# Patient Record
Sex: Male | Born: 1965 | Race: White | Hispanic: No | Marital: Married | Smoking: Never smoker
Health system: Southern US, Community
[De-identification: ages and names within clinical notes are randomized; demographics above are authoritative.]

## PROBLEM LIST (undated history)

## (undated) ENCOUNTER — Emergency Department (HOSPITAL_COMMUNITY): Discharge status: Absent without leave | Payer: Self-pay

## (undated) DIAGNOSIS — K649 Unspecified hemorrhoids: Secondary | ICD-10-CM

## (undated) DIAGNOSIS — Z9889 Other specified postprocedural states: Secondary | ICD-10-CM

## (undated) DIAGNOSIS — R112 Nausea with vomiting, unspecified: Secondary | ICD-10-CM

## (undated) DIAGNOSIS — G4733 Obstructive sleep apnea (adult) (pediatric): Secondary | ICD-10-CM

## (undated) DIAGNOSIS — E785 Hyperlipidemia, unspecified: Secondary | ICD-10-CM

## (undated) DIAGNOSIS — N529 Male erectile dysfunction, unspecified: Secondary | ICD-10-CM

## (undated) DIAGNOSIS — J45909 Unspecified asthma, uncomplicated: Secondary | ICD-10-CM

## (undated) DIAGNOSIS — M2550 Pain in unspecified joint: Secondary | ICD-10-CM

## (undated) DIAGNOSIS — Z87442 Personal history of urinary calculi: Secondary | ICD-10-CM

## (undated) DIAGNOSIS — R51 Headache: Secondary | ICD-10-CM

## (undated) DIAGNOSIS — Z8709 Personal history of other diseases of the respiratory system: Secondary | ICD-10-CM

## (undated) DIAGNOSIS — J302 Other seasonal allergic rhinitis: Secondary | ICD-10-CM

## (undated) DIAGNOSIS — J189 Pneumonia, unspecified organism: Secondary | ICD-10-CM

## (undated) DIAGNOSIS — K469 Unspecified abdominal hernia without obstruction or gangrene: Secondary | ICD-10-CM

## (undated) DIAGNOSIS — E119 Type 2 diabetes mellitus without complications: Secondary | ICD-10-CM

## (undated) HISTORY — PX: COLONOSCOPY: SHX174

## (undated) HISTORY — DX: Hyperlipidemia, unspecified: E78.5

## (undated) HISTORY — PX: MYRINGOTOMY: SUR874

## (undated) HISTORY — PX: ADENOIDECTOMY: SUR15

## (undated) HISTORY — PX: KNEE ARTHROSCOPY W/ ACL RECONSTRUCTION: SHX1858

## (undated) HISTORY — DX: Unspecified abdominal hernia without obstruction or gangrene: K46.9

## (undated) HISTORY — PX: CERVICAL SPINE SURGERY: SHX589

## (undated) HISTORY — PX: BUNIONECTOMY: SHX129

## (undated) HISTORY — PX: SHOULDER ARTHROSCOPY WITH ROTATOR CUFF REPAIR: SHX5685

## (undated) HISTORY — DX: Obstructive sleep apnea (adult) (pediatric): G47.33

## (undated) HISTORY — PX: ESOPHAGOGASTRODUODENOSCOPY: SHX1529

## (undated) HISTORY — DX: Male erectile dysfunction, unspecified: N52.9

## (undated) HISTORY — PX: NOSE SURGERY: SHX723

---

## 1996-06-17 HISTORY — PX: CHOLECYSTECTOMY: SHX55

## 2001-05-19 ENCOUNTER — Ambulatory Visit (HOSPITAL_COMMUNITY): Admission: RE | Admit: 2001-05-19 | Discharge: 2001-05-19 | Payer: Self-pay | Admitting: *Deleted

## 2001-05-19 ENCOUNTER — Encounter: Payer: Self-pay | Admitting: *Deleted

## 2001-08-18 ENCOUNTER — Encounter: Payer: Self-pay | Admitting: Critical Care Medicine

## 2001-08-18 ENCOUNTER — Ambulatory Visit (HOSPITAL_COMMUNITY): Admission: RE | Admit: 2001-08-18 | Discharge: 2001-08-18 | Payer: Self-pay | Admitting: Critical Care Medicine

## 2002-07-02 ENCOUNTER — Encounter: Payer: Self-pay | Admitting: Family Medicine

## 2002-07-02 ENCOUNTER — Ambulatory Visit (HOSPITAL_COMMUNITY): Admission: RE | Admit: 2002-07-02 | Discharge: 2002-07-02 | Payer: Self-pay | Admitting: Family Medicine

## 2004-06-07 ENCOUNTER — Ambulatory Visit: Payer: Self-pay | Admitting: Family Medicine

## 2004-10-24 ENCOUNTER — Emergency Department (HOSPITAL_COMMUNITY): Admission: EM | Admit: 2004-10-24 | Discharge: 2004-10-24 | Payer: Self-pay | Admitting: Emergency Medicine

## 2006-02-01 ENCOUNTER — Emergency Department (HOSPITAL_COMMUNITY): Admission: EM | Admit: 2006-02-01 | Discharge: 2006-02-01 | Payer: Self-pay | Admitting: Emergency Medicine

## 2006-08-26 ENCOUNTER — Emergency Department (HOSPITAL_COMMUNITY): Admission: EM | Admit: 2006-08-26 | Discharge: 2006-08-26 | Payer: Self-pay | Admitting: Emergency Medicine

## 2007-08-26 ENCOUNTER — Emergency Department (HOSPITAL_COMMUNITY): Admission: EM | Admit: 2007-08-26 | Discharge: 2007-08-26 | Payer: Self-pay | Admitting: Emergency Medicine

## 2008-11-28 ENCOUNTER — Encounter: Admission: RE | Admit: 2008-11-28 | Discharge: 2008-11-28 | Payer: Self-pay | Admitting: Sports Medicine

## 2008-12-12 ENCOUNTER — Encounter: Admission: RE | Admit: 2008-12-12 | Discharge: 2008-12-12 | Payer: Self-pay | Admitting: Sports Medicine

## 2008-12-26 ENCOUNTER — Encounter: Admission: RE | Admit: 2008-12-26 | Discharge: 2008-12-26 | Payer: Self-pay | Admitting: Sports Medicine

## 2009-05-02 ENCOUNTER — Ambulatory Visit (HOSPITAL_COMMUNITY): Admission: RE | Admit: 2009-05-02 | Discharge: 2009-05-02 | Payer: Self-pay | Admitting: Neurosurgery

## 2009-05-17 HISTORY — PX: CERVICAL FUSION: SHX112

## 2009-06-01 ENCOUNTER — Ambulatory Visit (HOSPITAL_COMMUNITY): Admission: RE | Admit: 2009-06-01 | Discharge: 2009-06-02 | Payer: Self-pay | Admitting: Neurosurgery

## 2010-09-18 LAB — BASIC METABOLIC PANEL
BUN: 12 mg/dL (ref 6–23)
CO2: 24 mEq/L (ref 19–32)
Calcium: 9.1 mg/dL (ref 8.4–10.5)
Chloride: 105 mEq/L (ref 96–112)
Creatinine, Ser: 0.99 mg/dL (ref 0.4–1.5)
GFR calc Af Amer: >60 mL/min (ref >60)
GFR calc non Af Amer: >60 mL/min (ref >60)
Glucose, Bld: 91 mg/dL (ref 70–99)
Potassium: 4.4 mEq/L (ref 3.5–5.1)
Sodium: 137 mEq/L (ref 135–145)

## 2010-09-18 LAB — CBC
HCT: 45.6 % (ref 39.0–52.0)
Hemoglobin: 15.7 g/dL (ref 13.0–17.0)
MCHC: 34.5 g/dL (ref 30.0–36.0)
MCV: 89.3 fL (ref 78.0–100.0)
Platelets: 229 10*3/uL (ref 150–400)
RBC: 5.1 MIL/uL (ref 4.22–5.81)
RDW: 12.4 % (ref 11.5–15.5)
WBC: 5.8 10*3/uL (ref 4.0–10.5)

## 2011-03-11 LAB — URINALYSIS, ROUTINE W REFLEX MICROSCOPIC
Bilirubin Urine: NEGATIVE
Glucose, UA: NEGATIVE
Hgb urine dipstick: NEGATIVE
Ketones, ur: NEGATIVE
Nitrite: NEGATIVE
Protein, ur: NEGATIVE
Specific Gravity, Urine: 1.038 — ABNORMAL HIGH
Urobilinogen, UA: 0.2
pH: 5.5

## 2012-03-16 ENCOUNTER — Encounter (INDEPENDENT_AMBULATORY_CARE_PROVIDER_SITE_OTHER): Payer: Self-pay

## 2012-03-18 ENCOUNTER — Encounter (INDEPENDENT_AMBULATORY_CARE_PROVIDER_SITE_OTHER): Payer: Self-pay | Admitting: General Surgery

## 2012-04-01 ENCOUNTER — Encounter (INDEPENDENT_AMBULATORY_CARE_PROVIDER_SITE_OTHER): Payer: Self-pay

## 2012-04-22 ENCOUNTER — Encounter (INDEPENDENT_AMBULATORY_CARE_PROVIDER_SITE_OTHER): Payer: Self-pay | Admitting: General Surgery

## 2012-04-22 ENCOUNTER — Ambulatory Visit (INDEPENDENT_AMBULATORY_CARE_PROVIDER_SITE_OTHER): Payer: BC Managed Care – PPO | Admitting: General Surgery

## 2012-04-22 VITALS — BP 150/78 | HR 80 | Temp 97.0°F | Resp 20 | Ht 73.0 in | Wt 323.2 lb

## 2012-04-22 DIAGNOSIS — K439 Ventral hernia without obstruction or gangrene: Secondary | ICD-10-CM

## 2012-04-22 NOTE — Patient Instructions (Addendum)
Call [phone] and ask for scheduling 

## 2012-04-22 NOTE — Progress Notes (Signed)
Patient ID: Jerome Boxavid G Swaim Jr., male   DOB: Sep 22, 1965, 46 y.o.   MRN: [id number]  Chief Complaint  Patient presents with  . Pre-op Exam    eval INC hernia    HPI Jerome BoxDavid G Swaim Jr. is a 46 y.o. male.  Ventral hernia HPI I was asked to see this pleasant gentleman in consultation by Dr. Clelia CroftShaw regarding ventral hernia. The patient first noticed it 3-4 years ago. It pops in and out but is frequently out. A couple of times it has been stuck out for one or 2 days. He has mild soreness without significant pain. No nausea or vomiting. The area also gets sore if he coughs.  Past Medical History  Diagnosis Date  . Hernia   . Obesity   . Metabolic syndrome   . OSA (obstructive sleep apnea)     on CPAP  . Nephrolithiasis   . Dyslipidemia   . Elevated BP   . Elevated LFTs   . ED (erectile dysfunction)   . IGT (impaired glucose tolerance)   . Scrotal cyst     Past Surgical History  Procedure Date  . Cholecystectomy 1998    Lap Chole  . Nose surgery   . Cervical fusion 12/10    C4-5 and C6-7  . Knee arthroscopy w/ acl reconstruction   . Nose surgery   . Cervical spine surgery   . Shoulder arthroscopy with rotator cuff repair     Lt shoulder    History reviewed. No pertinent family history.  Social History History  Substance Use Topics  . Smoking status: Never Smoker   . Smokeless tobacco: Never Used  . Alcohol Use: Yes    No Known Allergies  Current Outpatient Prescriptions  Medication Sig Dispense Refill  . Choline Fenofibrate (TRILIPIX) 135 MG capsule Take 135 mg by mouth daily.      Marland Kitchen. loratadine (CLARITIN) 10 MG tablet Take 10 mg by mouth daily.      . mometasone (NASONEX) 50 MCG/ACT nasal spray Place 1 spray into the nose 2 (two) times daily.      . Phentermine-Topiramate (QSYMIA) 7.5-46 MG CP24 Take 1 capsule by mouth daily.      . tadalafil (CIALIS) 20 MG tablet Take 20 mg by mouth daily as needed.      . Testosterone (ANDRODERM) 2 MG/24HR PT24 Place 1 patch onto the  skin daily.      . [DISCONTINUED] Phentermine-Topiramate (QSYMIA) 3.75-23 MG CP24 Take 1 capsule by mouth daily.        Review of Systems Review of Systems  Constitutional: Negative for fever, chills and unexpected weight change.  HENT: Negative for hearing loss, congestion, sore throat, trouble swallowing and voice change.   Eyes: Negative for visual disturbance.  Respiratory: Negative for cough and wheezing.   Cardiovascular: Negative for chest pain, palpitations and leg swelling.  Gastrointestinal: Positive for abdominal pain. Negative for nausea, vomiting, diarrhea, constipation, blood in stool, abdominal distention, anal bleeding and rectal pain.       See history of present illness  Genitourinary: Negative for hematuria and difficulty urinating.  Musculoskeletal: Negative for arthralgias.  Skin: Negative for rash and wound.  Neurological: Negative for seizures, syncope, weakness and headaches.  Hematological: Negative for adenopathy. Does not bruise/bleed easily.  Psychiatric/Behavioral: Negative for confusion.    Blood pressure 150/78, pulse 80, temperature 97 F (36.1 C), temperature source Temporal, resp. rate 20, height 6\' 1"  (1.854 m), weight 323 lb 3.2 oz (146.603 kg).  Physical Exam  Physical Exam  Constitutional: He is oriented to person, place, and time. He appears well-developed and well-nourished. No distress.  HENT:  Head: Normocephalic and atraumatic.  Mouth/Throat: No oropharyngeal exudate.  Eyes: EOM are normal. Pupils are equal, round, and reactive to light.  Neck: Normal range of motion. Neck supple. No tracheal deviation present.  Cardiovascular: Normal rate, regular rhythm, normal heart sounds and intact distal pulses.   Pulmonary/Chest: Effort normal and breath sounds normal. No stridor. No respiratory distress. He has no wheezes. He has no rales.  Abdominal: Soft. He exhibits no distension. There is no tenderness. There is no rebound and no guarding.          Ventral hernia with 3 cm fascial defect  spontaneously reduced  Musculoskeletal: Normal range of motion.  Neurological: He is alert and oriented to person, place, and time.  Skin: Skin is warm.    Data Reviewed Office notes from Dr. Clelia Croft  Assessment    Ventral hernia    Plan    I have offered laparoscopic repair of ventral hernia with mesh. Procedure, risks, benefits were discussed in detail with the patient. I answered his questions. He would like to discuss things with his wife and then call back to pick a time to schedule. I advised him on signs and symptoms of incarceration and strangulation to watch out for. He does agree to call us if any of that develops in the interim.       [name] E 04/22/2012, 9:29 AM

## 2012-04-23 ENCOUNTER — Encounter (HOSPITAL_COMMUNITY): Payer: Self-pay | Admitting: Emergency Medicine

## 2012-04-23 ENCOUNTER — Emergency Department (HOSPITAL_COMMUNITY)
Admission: EM | Admit: 2012-04-23 | Discharge: 2012-04-23 | Disposition: A | Discharge status: Home / Self Care | Payer: BC Managed Care – PPO | Attending: Emergency Medicine | Admitting: Emergency Medicine

## 2012-04-23 ENCOUNTER — Emergency Department (HOSPITAL_COMMUNITY): Payer: BC Managed Care – PPO

## 2012-04-23 DIAGNOSIS — Z8719 Personal history of other diseases of the digestive system: Secondary | ICD-10-CM | POA: Insufficient documentation

## 2012-04-23 DIAGNOSIS — N201 Calculus of ureter: Secondary | ICD-10-CM | POA: Insufficient documentation

## 2012-04-23 DIAGNOSIS — N200 Calculus of kidney: Secondary | ICD-10-CM | POA: Insufficient documentation

## 2012-04-23 DIAGNOSIS — Z79899 Other long term (current) drug therapy: Secondary | ICD-10-CM | POA: Insufficient documentation

## 2012-04-23 DIAGNOSIS — Z9089 Acquired absence of other organs: Secondary | ICD-10-CM | POA: Insufficient documentation

## 2012-04-23 DIAGNOSIS — R109 Unspecified abdominal pain: Secondary | ICD-10-CM

## 2012-04-23 DIAGNOSIS — E669 Obesity, unspecified: Secondary | ICD-10-CM | POA: Insufficient documentation

## 2012-04-23 DIAGNOSIS — G4733 Obstructive sleep apnea (adult) (pediatric): Secondary | ICD-10-CM | POA: Insufficient documentation

## 2012-04-23 DIAGNOSIS — E785 Hyperlipidemia, unspecified: Secondary | ICD-10-CM | POA: Insufficient documentation

## 2012-04-23 DIAGNOSIS — R7309 Other abnormal glucose: Secondary | ICD-10-CM | POA: Insufficient documentation

## 2012-04-23 DIAGNOSIS — R7989 Other specified abnormal findings of blood chemistry: Secondary | ICD-10-CM | POA: Insufficient documentation

## 2012-04-23 DIAGNOSIS — R030 Elevated blood-pressure reading, without diagnosis of hypertension: Secondary | ICD-10-CM | POA: Insufficient documentation

## 2012-04-23 LAB — POCT I-STAT, CHEM 8
BUN: 22 mg/dL (ref 6–23)
Calcium, Ion: 1.17 mmol/L (ref 1.12–1.23)
Chloride: 111 mEq/L (ref 96–112)
Creatinine, Ser: 1.4 mg/dL — ABNORMAL HIGH (ref 0.50–1.35)
Glucose, Bld: 111 mg/dL — ABNORMAL HIGH (ref 70–99)
HCT: 47 % (ref 39.0–52.0)
Hemoglobin: 16 g/dL (ref 13.0–17.0)
Potassium: 3.8 mEq/L (ref 3.5–5.1)
Sodium: 142 mEq/L (ref 135–145)
TCO2: 19 mmol/L (ref 0–100)

## 2012-04-23 LAB — URINALYSIS, ROUTINE W REFLEX MICROSCOPIC
Bilirubin Urine: NEGATIVE
Glucose, UA: NEGATIVE mg/dL
Hgb urine dipstick: NEGATIVE
Ketones, ur: NEGATIVE mg/dL
Nitrite: NEGATIVE
Protein, ur: NEGATIVE mg/dL
Specific Gravity, Urine: 1.027 (ref 1.005–1.030)
Urobilinogen, UA: 1 mg/dL (ref 0.0–1.0)
pH: 6 (ref 5.0–8.0)

## 2012-04-23 LAB — URINE MICROSCOPIC-ADD ON

## 2012-04-23 MED ORDER — HYDROMORPHONE HCL PF 1 MG/ML IJ SOLN
1.0000 mg | Freq: Once | INTRAMUSCULAR | Status: AC
  Administered 2012-04-23: 1 mg via INTRAVENOUS
  Filled 2012-04-23: qty 1

## 2012-04-23 MED ORDER — SODIUM CHLORIDE 0.9 % IV SOLN
Freq: Once | INTRAVENOUS | Status: AC
  Administered 2012-04-23: 09:00:00 via INTRAVENOUS

## 2012-04-23 MED ORDER — ONDANSETRON HCL 4 MG/2ML IJ SOLN
4.0000 mg | Freq: Once | INTRAMUSCULAR | Status: AC
  Administered 2012-04-23: 4 mg via INTRAVENOUS
  Filled 2012-04-23: qty 2

## 2012-04-23 MED ORDER — ONDANSETRON HCL 4 MG PO TABS: 4.0000 mg | ORAL_TABLET | Freq: Four times a day (QID) | ORAL | Status: DC

## 2012-04-23 MED ORDER — OXYCODONE-ACETAMINOPHEN 5-325 MG PO TABS: 1.0000 | ORAL_TABLET | Freq: Four times a day (QID) | ORAL | Status: DC | PRN

## 2012-04-23 NOTE — Discharge Instructions (Signed)
Return to the ED with any concerns including fever/chills, vomiting and not able to keep down liquids, decreased level of alertness/lethargy, or any other alarming symptoms °

## 2012-04-23 NOTE — ED Notes (Signed)
Patient states he has a h/o kidney stones on the left side.  Patient has had lower left flank pain and LLQ abdominal pain with the urge to void and have a bowel movement.  Patient reports waves of pain and denies fevers.

## 2012-04-23 NOTE — ED Notes (Signed)
Pt states pain began in his back yesterday and became increasingly severe over night. Pt groaning and rolling side to side. Wife at bedside.

## 2012-04-23 NOTE — ED Provider Notes (Signed)
History     CSN: [id number]  Arrival date & time 04/23/12  0754   First MD Initiated Contact with Patient 04/23/12 0804      Chief Complaint  Patient presents with  . Flank Pain    left flank pain    (Consider location/radiation/quality/duration/timing/severity/associated sxs/prior treatment) HPI Pt presents with left sided flank pain radiating down to left lower abdomen which began this morning.  Pain described as sharp and similar to his prior kidney stones.  No fever/chills.  Feels nauseated but has not vomited.  Pain comes intermittently.  Has not taken anything for his pain this morning.  There are no other associated systemic symptoms, there are no other alleviating or modifying factors.   Past Medical History  Diagnosis Date  . Hernia   . Obesity   . Metabolic syndrome   . OSA (obstructive sleep apnea)     on CPAP  . Nephrolithiasis   . Dyslipidemia   . Elevated BP   . Elevated LFTs   . ED (erectile dysfunction)   . IGT (impaired glucose tolerance)   . Scrotal cyst     Past Surgical History  Procedure Date  . Cholecystectomy 1998    Lap Chole  . Nose surgery   . Cervical fusion 12/10    C4-5 and C6-7  . Knee arthroscopy w/ acl reconstruction   . Nose surgery   . Cervical spine surgery   . Shoulder arthroscopy with rotator cuff repair     Lt shoulder    History reviewed. No pertinent family history.  History  Substance Use Topics  . Smoking status: Never Smoker   . Smokeless tobacco: Never Used  . Alcohol Use: Yes      Review of Systems ROS reviewed and all otherwise negative except for mentioned in HPI  Allergies  Review of patient's allergies indicates no known allergies.  Home Medications   Current Outpatient Rx  Name  Route  Sig  Dispense  Refill  . CHOLINE FENOFIBRATE 135 MG PO CPDR   Oral   Take 135 mg by mouth daily.         Marland Kitchen LORATADINE 10 MG PO TABS   Oral   Take 10 mg by mouth daily.         . MOMETASONE FUROATE 50  MCG/ACT NA SUSP   Nasal   Place 1 spray into the nose 2 (two) times daily.         Marland Kitchen PHENTERMINE-TOPIRAMATE 7.5-46 MG PO CP24   Oral   Take 1 capsule by mouth daily.         Marland Kitchen ONDANSETRON HCL 4 MG PO TABS   Oral   Take 1 tablet (4 mg total) by mouth every 6 (six) hours.   12 tablet   0   . OXYCODONE-ACETAMINOPHEN 5-325 MG PO TABS   Oral   Take 1-2 tablets by mouth every 6 (six) hours as needed for pain.   15 tablet   0     BP 139/79  Pulse 67  Temp 98.6 F (37 C) (Oral)  Resp 20  Ht 6\' 1"  (1.854 m)  Wt 320 lb (145.151 kg)  BMI 42.22 kg/m2  SpO2 97% Vitals reviewed Physical Exam Physical Examination: General appearance - alert, well appearing, and in no distress Mental status - alert, oriented to person, place, and time Eyes - no conjunctival injection, no scleral icterus Mouth - mucous membranes moist, pharynx normal without lesions Chest - clear to auscultation, no wheezes,  rales or rhonchi, symmetric air entry Heart - normal rate, regular rhythm, normal S1, S2, no murmurs, rubs, clicks or gallops Back- left CVA tenderness Abdomen - soft, nontender, nondistended, no masses or organomegaly Extremities - peripheral pulses normal, no pedal edema, no clubbing or cyanosis Skin - normal coloration and turgor, no rashes  ED Course  Procedures (including critical care time)  Labs Reviewed  URINALYSIS, ROUTINE W REFLEX MICROSCOPIC - Abnormal; Notable for the following:    Leukocytes, UA SMALL (*)     All other components within normal limits  POCT I-STAT, CHEM 8 - Abnormal; Notable for the following:    Creatinine, Ser 1.40 (*)     Glucose, Bld 111 (*)     All other components within normal limits  URINE MICROSCOPIC-ADD ON - Abnormal; Notable for the following:    Bacteria, UA FEW (*)     All other components within normal limits  URINE CULTURE   Ct Abdomen Pelvis Wo Contrast  04/23/2012  *RADIOLOGY REPORT*  Clinical Data: Left lower quadrant/left flank pain   CT ABDOMEN AND PELVIS WITHOUT CONTRAST  Technique:  Multidetector CT imaging of the abdomen and pelvis was performed following the standard protocol without intravenous contrast.  Comparison: 08/26/2007  Findings: Lung bases are clear.  Severe hepatic steatosis.  Spleen, pancreas, and adrenal glands are within normal limits.  Status post cholecystectomy.  No intrahepatic or extrahepatic ductal dilatation.  Punctate nonobstructing left renal calculus (series 2/image 44). Mild fullness of the left renal collecting system.  6 mm left proximal ureteral calculus (coronal image 77).  Right kidney is unremarkable.  No renal calculi or hydronephrosis.  No evidence of bowel obstruction.  Normal appendix.  No evidence of abdominal aortic aneurysm.  No abdominopelvic ascites.  Small retroperitoneal lymph node which do not meet pathologic CT size criteria.  Prostate is unremarkable.  No distal ureteral or bladder calculi.  Moderate fat-containing midline ventral hernia (series 2/image 44).  Mild degenerative changes of the visualized thoracolumbar spine.  IMPRESSION: 6 mm left proximal ureteral calculus with mild fullness of the left renal collecting system.  Additional punctate nonobstructing left renal calculus.  Severe hepatic steatosis.   Original Report Authenticated By: Charline Bills, M.D.      1. Ureteral stone   2. Flank pain       MDM  Pt presenting with left flank pain c/w prior renal stones.  Labs obtained and CT scan shows 6mm proximal stone.  Pt's pain is under control with pain medications, no renal failure or signs of UTI.  Pt discharged with rx for pain/nausea meds and instructed to f/u with Dr. Retta Diones.  Discharged with strict return precautions.  Pt agreeable with plan.        Ethelda Chick, MD 04/23/12 1028

## 2012-04-24 LAB — URINE CULTURE
Colony Count: NO GROWTH
Culture: NO GROWTH

## 2013-03-27 ENCOUNTER — Emergency Department (INDEPENDENT_AMBULATORY_CARE_PROVIDER_SITE_OTHER)
Admission: EM | Admit: 2013-03-27 | Discharge: 2013-03-27 | Disposition: A | Discharge status: Home / Self Care | Payer: BC Managed Care – PPO | Source: Home / Self Care

## 2013-03-27 ENCOUNTER — Encounter (HOSPITAL_COMMUNITY): Payer: Self-pay | Admitting: Emergency Medicine

## 2013-03-27 DIAGNOSIS — S61209A Unspecified open wound of unspecified finger without damage to nail, initial encounter: Secondary | ICD-10-CM

## 2013-03-27 DIAGNOSIS — S61309A Unspecified open wound of unspecified finger with damage to nail, initial encounter: Secondary | ICD-10-CM

## 2013-03-27 DIAGNOSIS — S6000XA Contusion of unspecified finger without damage to nail, initial encounter: Secondary | ICD-10-CM

## 2013-03-27 MED ORDER — BACITRACIN-NEOMYCIN-POLYMYXIN OINTMENT TUBE
TOPICAL_OINTMENT | Freq: Once | CUTANEOUS | Status: AC
  Administered 2013-03-27: 12:00:00 via TOPICAL

## 2013-03-27 MED ORDER — CEPHALEXIN 500 MG PO CAPS: 500.0000 mg | ORAL_CAPSULE | Freq: Four times a day (QID) | ORAL | Status: DC

## 2013-03-27 MED ORDER — HYDROCODONE-ACETAMINOPHEN 5-325 MG PO TABS: 1.0000 | ORAL_TABLET | ORAL | Status: DC | PRN

## 2013-03-27 NOTE — ED Provider Notes (Signed)
CSN: [id number]     Arrival date & time 03/27/13  5621 History   None    Chief Complaint  Patient presents with  . Finger Injury   (Consider location/radiation/quality/duration/timing/severity/associated sxs/prior Treatment) HPI Comments: 47 year old male complaining of right thumb pain after he suffered an impaction injury striking his thumb against a table. The nail was fractured and recessed into the skin just proximal to the lunula. Not a crushing type injury.   Past Medical History  Diagnosis Date  . Hernia   . Obesity   . Metabolic syndrome   . OSA (obstructive sleep apnea)     on CPAP  . Nephrolithiasis   . Dyslipidemia   . Elevated BP   . Elevated LFTs   . ED (erectile dysfunction)   . IGT (impaired glucose tolerance)   . Scrotal cyst    Past Surgical History  Procedure Laterality Date  . Cholecystectomy  1998    Lap Chole  . Nose surgery    . Cervical fusion  12/10    C4-5 and C6-7  . Knee arthroscopy w/ acl reconstruction    . Nose surgery    . Cervical spine surgery    . Shoulder arthroscopy with rotator cuff repair      Lt shoulder   No family history on file. History  Substance Use Topics  . Smoking status: Never Smoker   . Smokeless tobacco: Never Used  . Alcohol Use: Yes    Review of Systems  Constitutional: Negative.   Respiratory: Negative.   Gastrointestinal: Negative.   Genitourinary: Negative.   Musculoskeletal:       As per HPI  Skin: Negative.   Neurological: Negative for dizziness, weakness, numbness and headaches.    Allergies  Review of patient's allergies indicates no known allergies.  Home Medications   Current Outpatient Rx  Name  Route  Sig  Dispense  Refill  . loratadine (CLARITIN) 10 MG tablet   Oral   Take 10 mg by mouth daily.         . cephALEXin (KEFLEX) 500 MG capsule   Oral   Take 1 capsule (500 mg total) by mouth 4 (four) times daily.   12 capsule   0   . Choline Fenofibrate (TRILIPIX) 135 MG capsule   Oral   Take 135 mg by mouth daily.         Marland Kitchen HYDROcodone-acetaminophen (NORCO/VICODIN) 5-325 MG per tablet   Oral   Take 1 tablet by mouth every 4 (four) hours as needed for pain.   15 tablet   0   . mometasone (NASONEX) 50 MCG/ACT nasal spray   Nasal   Place 1 spray into the nose 2 (two) times daily.         . ondansetron (ZOFRAN) 4 MG tablet   Oral   Take 1 tablet (4 mg total) by mouth every 6 (six) hours.   12 tablet   0   . oxyCODONE-acetaminophen (PERCOCET/ROXICET) 5-325 MG per tablet   Oral   Take 1-2 tablets by mouth every 6 (six) hours as needed for pain.   15 tablet   0   . Phentermine-Topiramate (QSYMIA) 7.5-46 MG CP24   Oral   Take 1 capsule by mouth daily.          BP 139/92  Pulse 73  Temp(Src) 98.2 F (36.8 C) (Oral)  Resp 18  SpO2 96% Physical Exam  Constitutional: He is oriented to person, place, and time. He appears well-developed and  well-nourished.  HENT:  Head: Normocephalic and atraumatic.  Eyes: EOM are normal. Left eye exhibits no discharge.  Neck: Normal range of motion. Neck supple.  Musculoskeletal: Normal range of motion. He exhibits tenderness. He exhibits no edema.       Arms: No joint tenderness or swelling. Extension and flexion intact against strong resistance. No phalange tenderness. Tenderness over the injured portion of the nail only  Neurological: He is alert and oriented to person, place, and time. No cranial nerve deficit.  Skin: Skin is warm and dry.  Psychiatric: He has a normal mood and affect.    ED Course  NAIL REMOVAL Date/Time: 03/27/2013 11:45 AM Performed by: Phineas RealMABE, Elmer Authorized by: Phineas RealMABE, Duwayne Consent: Verbal consent obtained. Risks and benefits: risks, benefits and alternatives were discussed Consent given by: patient Patient understanding: patient states understanding of the procedure being performed Patient identity confirmed: verbally with patient Location: right hand Location details: right  thumb Anesthesia: digital block Local anesthetic: lidocaine 2% without epinephrine and bupivacaine 0.25% without epinephrine Anesthetic total: 9 ml Patient sedated: no Preparation: skin prepped with Betadine Amount removed: 1/3 Side: ulnar Wedge excision of skin of nail fold: no Nail bed sutured: no Nail matrix removed: complete Dressing: antibiotic ointment and dressing applied Patient tolerance: Patient tolerated the procedure well with no immediate complications.   (including critical care time) Labs Review Labs Reviewed - No data to display Imaging Review No results found.    MDM   1. Nail avulsion, finger, initial encounter   2. Finger contusion, initial encounter       One third of the loose nail of the right thumb was removed. Come Neosporin and sterile dressing. Change dressing daily and use Neosporin. Watch for any signs of infection seek medical attention for problems. Patient notes that he saw his physician recently and that he was told all of his immunizations were up-to-date.  Hayden Rasmussenavid Lulani Bour, NP 03/27/13 1157  Hayden Rasmussenavid Dyke Weible, NP 03/27/13 1158

## 2013-03-27 NOTE — Discharge Instructions (Signed)
Fingernail Removal Fingernails may need to be removed because of injury, infections, or correction of abnormal growth. A special non-stick bandage has been put on your finger tightly to prevent bleeding. Fingernails will usually grow back if the finger has not been badly injured and you carefully follow instructions. HOME CARE INSTRUCTIONS   Keep your hand elevated above your heart to relieve pain and swelling.  Keep your dressing dry and clean.  Change your bandage in 24 hours.  After your bandage is changed, soak your hand in warm soapy water for 10 to 20 minutes. Do this 3 times per day. This helps reduce pain and swelling. After soaking your hand, apply a clean, dry bandage. Change your bandage if it is wet or dirty.  Only take over-the-counter or prescription medicines for pain, discomfort, or fever as directed by your caregiver.  See your caregiver as needed for problems.  You may have received an instruction to follow up with your caregiver or a specialist. The failure to follow up as instructed could result in the permanent loss of a fingernail. SEEK IMMEDIATE MEDICAL CARE IF:   You have increased pain, swelling, drainage, or bleeding.  You have a fever. MAKE SURE YOU:   Understand these instructions.  Will watch your condition.  Will get help right away if you are not doing well or get worse. Document Released: 05/31/2000 Document Revised: 08/26/2011 Document Reviewed: 10/06/2007 [facility] Patient Information 2014 [place], [place].  Fingertip Injuries and Amputations Fingertip injuries are common and often get injured because they are last to escape when pulling your hand out of harm's way. You have amputated (cut off) part of your finger. How this turns out depends largely on how much was amputated. If just the tip is amputated, often the end of the finger will grow back and the finger may return to much the same as it was before the injury.  If more of the finger is  missing, your caregiver has done the best with the tissue remaining to allow you to keep as much finger as is possible. Your caregiver after checking your injury has tried to leave you with a painless fingertip that has durable, feeling skin. If possible, your caregiver has tried to maintain the finger's length and appearance and preserve its fingernail.  Please read the instructions outlined below and refer to this sheet in the next few weeks. These instructions provide you with general information on caring for yourself. Your caregiver may also give you specific instructions. While your treatment has been done according to the most current medical practices available, unavoidable complications occasionally occur. If you have any problems or questions after discharge, please call your caregiver. HOME CARE INSTRUCTIONS   You may resume normal diet and activities as directed or allowed.  Keep your hand elevated above the level of your heart. This helps decrease pain and swelling.  Keep ice packs (or a bag of ice wrapped in a towel) on the injured area for 15-20 minutes, 3-4 times per day, for the first two days.  Change dressings if necessary or as directed.  Clean the wound daily or as directed.  Only take over-the-counter or prescription medicines for pain, discomfort, or fever as directed by your caregiver.  Keep appointments as directed. SEEK IMMEDIATE MEDICAL CARE IF:  You develop redness, swelling, numbness or increasing pain in the wound.  There is pus coming from the wound.  You develop an unexplained oral temperature above 102 F (38.9 C) or as your caregiver  suggests.  There is a foul (bad) smell coming from the wound or dressing.  There is a breaking open of the wound (edges not staying together) after sutures or staples have been removed. MAKE SURE YOU:   Understand these instructions.  Will watch your condition.  Will get help right away if you are not doing well or get  worse. Document Released: 04/24/2005 Document Revised: 08/26/2011 Document Reviewed: 03/23/2008 [facility] Patient Information 2014 [place], [place].  Nail Avulsion Injury Nail avulsion means that you have lost the whole, or part of a nail. The nail will usually grow back in 2 to 6 months. If your injury damaged the growth center of the nail, the nail may be deformed, split, or not stuck to the nail bed. Sometimes the avulsed nail is stitched back in place. This provides temporary protection to the nail bed until the new nail grows in.  HOME CARE INSTRUCTIONS   Raise (elevate) your injury as much as possible.  Protect the injury and cover it with bandages (dressings) or splints as instructed.  Change dressings as instructed. SEEK MEDICAL CARE IF:   There is increasing pain, redness, or swelling.  You cannot move your fingers or toes. Document Released: 07/11/2004 Document Revised: 08/26/2011 Document Reviewed: 05/05/2009 [facility] Patient Information 2014 [place], [place].

## 2013-03-27 NOTE — ED Notes (Signed)
Pt c/o right thumb/nail inj onset 0830 today Reports he was loading his car when he jammed his thumb into a metal bolt of the car??? Sxs include: mild pain w/inj to nail bed... Bleeding controlled... Last tetanus unknown Alert w/no signs of acute distress.

## 2013-03-28 NOTE — ED Provider Notes (Signed)
Medical screening examination/treatment/procedure(s) were performed by resident physician or non-physician practitioner and as supervising physician I was immediately available for consultation/collaboration.   Darah Simkin DOUGLAS MD.   Akaisha Truman D Cire Deyarmin, MD 03/28/13 1237 

## 2013-05-15 ENCOUNTER — Encounter (HOSPITAL_COMMUNITY): Payer: Self-pay | Admitting: Emergency Medicine

## 2013-05-15 ENCOUNTER — Emergency Department (HOSPITAL_COMMUNITY)
Admission: EM | Admit: 2013-05-15 | Discharge: 2013-05-15 | Disposition: A | Discharge status: Home / Self Care | Payer: BC Managed Care – PPO | Attending: Emergency Medicine | Admitting: Emergency Medicine

## 2013-05-15 DIAGNOSIS — Z8719 Personal history of other diseases of the digestive system: Secondary | ICD-10-CM | POA: Insufficient documentation

## 2013-05-15 DIAGNOSIS — Z87442 Personal history of urinary calculi: Secondary | ICD-10-CM | POA: Insufficient documentation

## 2013-05-15 DIAGNOSIS — E669 Obesity, unspecified: Secondary | ICD-10-CM | POA: Insufficient documentation

## 2013-05-15 DIAGNOSIS — G4733 Obstructive sleep apnea (adult) (pediatric): Secondary | ICD-10-CM | POA: Insufficient documentation

## 2013-05-15 DIAGNOSIS — Z79899 Other long term (current) drug therapy: Secondary | ICD-10-CM | POA: Insufficient documentation

## 2013-05-15 DIAGNOSIS — Z862 Personal history of diseases of the blood and blood-forming organs and certain disorders involving the immune mechanism: Secondary | ICD-10-CM | POA: Insufficient documentation

## 2013-05-15 DIAGNOSIS — Z8639 Personal history of other endocrine, nutritional and metabolic disease: Secondary | ICD-10-CM | POA: Insufficient documentation

## 2013-05-15 DIAGNOSIS — H109 Unspecified conjunctivitis: Secondary | ICD-10-CM | POA: Insufficient documentation

## 2013-05-15 DIAGNOSIS — Z981 Arthrodesis status: Secondary | ICD-10-CM | POA: Insufficient documentation

## 2013-05-15 MED ORDER — MOXIFLOXACIN HCL 0.5 % OP SOLN: 1.0000 [drp] | Freq: Three times a day (TID) | OPHTHALMIC | Status: DC

## 2013-05-15 MED ORDER — PREDNISONE 20 MG PO TABS
60.0000 mg | ORAL_TABLET | Freq: Once | ORAL | Status: AC
  Administered 2013-05-15: 60 mg via ORAL
  Filled 2013-05-15: qty 3

## 2013-05-15 NOTE — Discharge Instructions (Signed)
You have been prescribed a new antibiotic eyedrop to help with your symptoms and infection. Continue to use cool washcloths to help with comfort and remove drainage. Use eyedrops to also help rinse eyes. Take ibuprofen for pain and inflammation. Followup within ophthalmology specialist on Monday for continued evaluation and treatment. Return anytime for worsening symptoms such as increasing pain, vision changes or loss of vision.    Conjunctivitis Conjunctivitis is commonly called "pink eye." Conjunctivitis can be caused by bacterial or viral infection, allergies, or injuries. There is usually redness of the lining of the eye, itching, discomfort, and sometimes discharge. There may be deposits of matter along the eyelids. A viral infection usually causes a watery discharge, while a bacterial infection causes a yellowish, thick discharge. Pink eye is very contagious and spreads by direct contact. You may be given antibiotic eyedrops as part of your treatment. Before using your eye medicine, remove all drainage from the eye by washing gently with warm water and cotton balls. Continue to use the medication until you have awakened 2 mornings in a row without discharge from the eye. Do not rub your eye. This increases the irritation and helps spread infection. Use separate towels from other household members. Wash your hands with soap and water before and after touching your eyes. Use cold compresses to reduce pain and sunglasses to relieve irritation from light. Do not wear contact lenses or wear eye makeup until the infection is gone. SEEK MEDICAL CARE IF:   Your symptoms are not better after 3 days of treatment.  You have increased pain or trouble seeing.  The outer eyelids become very red or swollen. Document Released: 07/11/2004 Document Revised: 08/26/2011 Document Reviewed: 06/03/2005 [facility] Patient Information 2014 [place], [place].

## 2013-05-15 NOTE — ED Notes (Signed)
Pt c/o redness, itching and burning to eyes since Tues. Pt has been seen twice for the same thing and states he is getting worse. States he was having more itching and redness from some of the eyedrops prescribed and is now on a new antibiotic eyedrop Rx. Pt states his eyes continue to feel worse. Pt c/o pressure, swelling and constant discharge from eyes.

## 2013-05-15 NOTE — ED Provider Notes (Signed)
CSN: [id number]     Arrival date & time 05/15/13  2050 History   First MD Initiated Contact with Patient 05/15/13 2056     Chief Complaint  Patient presents with  . Eye Problem   HPI  History provided by patient. Patient is a 47 year old male who presents with concerns for worsening bilateral eye redness, pain and drainage. Patient reports being at work last Tuesday when he suddenly felt some bilateral high irritation. He does recall rubbing his eyes slightly and after returning home had continued redness and irritation of the eyes. Patient went to a local urgent care clinic on Wednesday and was diagnosed with conjunctivitis. He was given a prescription for neomycin eyedrop as well as in allergy eyedrop medicine. He began using this [place] and felt his symptoms were significantly worsening with the new medicines. He was seen at an urgent care center while on vacation and given new prescription for Polytrim. He has been using that since Thursday but states his symptoms have continued to gradually worsened each day. Patient has also been taking Benadryl throughout the day without any significant improvement. He occasionally uses Advil for the pain with good relief. Denies any other associated symptoms. No nasal congestion, rhinorrhea, sore throat or cough. No fever, chills or sweats. He has occasional blurred vision from drainage but denies any other vision change or vision loss.    Past Medical History  Diagnosis Date  . Hernia   . Obesity   . Metabolic syndrome   . OSA (obstructive sleep apnea)     on CPAP  . Nephrolithiasis   . Dyslipidemia   . Elevated BP   . Elevated LFTs   . ED (erectile dysfunction)   . IGT (impaired glucose tolerance)   . Scrotal cyst    Past Surgical History  Procedure Laterality Date  . Cholecystectomy  1998    Lap Chole  . Nose surgery    . Cervical fusion  12/10    C4-5 and C6-7  . Knee arthroscopy w/ acl  reconstruction    . Nose surgery    . Cervical spine surgery    . Shoulder arthroscopy with rotator cuff repair      Lt shoulder   No family history on file. History  Substance Use Topics  . Smoking status: Never Smoker   . Smokeless tobacco: Never Used  . Alcohol Use: Yes    Review of Systems  Constitutional: Negative for fever and chills.  HENT: Negative for congestion, rhinorrhea and sore throat.   Eyes: Positive for photophobia, pain, discharge, redness and itching.  Respiratory: Negative for cough.   All other systems reviewed and are negative.    Allergies  Review of patient's allergies indicates no known allergies.  Home Medications   Current Outpatient Rx  Name  Route  Sig  Dispense  Refill  . diphenhydrAMINE (BENADRYL) 25 MG tablet   Oral   Take 25 mg by mouth every 6 (six) hours as needed.         . loratadine (CLARITIN) 10 MG tablet   Oral   Take 10 mg by mouth daily.         Marland Kitchen. trimethoprim-polymyxin b (POLYTRIM) ophthalmic solution   Both Eyes   Place 2 drops into both eyes every 6 (six) hours.         . moxifloxacin (VIGAMOX) 0.5 % ophthalmic solution   Both Eyes   Place 1 drop into  both eyes 3 (three) times daily.   3 mL   0    BP 147/95  Pulse 96  Temp(Src) 97.9 F (36.6 C) (Oral)  Resp 18  Ht 6\' 1"  (1.854 m)  Wt 285 lb (129.275 kg)  BMI 37.61 kg/m2  SpO2 96% Physical Exam  Nursing note and vitals reviewed. Constitutional: He is oriented to person, place, and time. He appears well-developed and well-nourished. No distress.  HENT:  Head: Normocephalic.  Mouth/Throat: Oropharynx is clear and moist.  Eyes: EOM are normal. Pupils are equal, round, and reactive to light. Right eye exhibits discharge. Left eye exhibits discharge. Right conjunctiva is injected. Left conjunctiva is injected.  Significant edema of the conjunctiva with yellow purulent discharge.  Neck: Normal range of motion. Neck supple.  Cardiovascular: Normal rate and  regular rhythm.   Pulmonary/Chest: Effort normal and breath sounds normal. No respiratory distress. He has no wheezes. He has no rales.  Lymphadenopathy:    He has no cervical adenopathy.  Neurological: He is alert and oriented to person, place, and time.  Skin: Skin is warm.  Psychiatric: He has a normal mood and affect. His behavior is normal.    ED Course  Procedures  9:50 PM patient seen and evaluated. Patient appears in mild discomfort no acute distress. Examined symptoms consistent with conjunctivitis. Discussed treatment plan with patient which includes an addition of Aloxi ofloxacin eyedrops. Patient instructed to continue Benadryl in the evening. We'll also give one dose of prednisone here for inflammation. Patient will be provided ophthalmology referral to followup on Monday. He was given strict return precautions.   MDM   1. Conjunctivitis of both eyes        Angus Seller, [place] 05/15/13 2212

## 2013-05-17 NOTE — ED Provider Notes (Signed)
Medical screening examination/treatment/procedure(s) were performed by non-physician practitioner and as supervising physician I was immediately available for consultation/collaboration.  EKG Interpretation   None         Brandice Busser J. Shemeca Lukasik, MD 05/17/13 1621 

## 2013-05-19 ENCOUNTER — Encounter (INDEPENDENT_AMBULATORY_CARE_PROVIDER_SITE_OTHER): Payer: Self-pay | Admitting: General Surgery

## 2013-05-19 ENCOUNTER — Ambulatory Visit (INDEPENDENT_AMBULATORY_CARE_PROVIDER_SITE_OTHER): Payer: BC Managed Care – PPO | Admitting: General Surgery

## 2013-05-19 VITALS — BP 140/88 | HR 66 | Temp 97.3°F | Resp 12 | Ht 73.0 in | Wt 297.0 lb

## 2013-05-19 DIAGNOSIS — K439 Ventral hernia without obstruction or gangrene: Secondary | ICD-10-CM

## 2013-05-19 NOTE — Progress Notes (Signed)
Patient ID: Jerome Kline, male   DOB: March 08, 1966, 47 y.o.   MRN: [id number]  Chief Complaint  Patient presents with  . Incisional Hernia    HPI Jerome Kline is a 47 y.o. male.  Chief complaint: Ventral hernia HPI I saw him last year regarding this ventral hernia. We discussed laparoscopic repair at that time. He held off due to his schedule. In the interim, he claims hernias and a bit larger. He has lost some weight and it is more noticeable. It also bothers him intermittently with some mild discomfort. Also gives him some difficulty when he exercises. He returns for reevaluation and scheduling.Of note, he is currently being treated for conjunctivitis by his primary care physician, Dr. Clelia Croft. Past Medical History  Diagnosis Date  . Hernia   . Obesity   . Metabolic syndrome   . OSA (obstructive sleep apnea)     on CPAP  . Nephrolithiasis   . Dyslipidemia   . Elevated BP   . Elevated LFTs   . ED (erectile dysfunction)   . IGT (impaired glucose tolerance)   . Scrotal cyst     Past Surgical History  Procedure Laterality Date  . Cholecystectomy  1998    Lap Chole  . Nose surgery    . Cervical fusion  12/10    C4-5 and C6-7  . Knee arthroscopy w/ acl reconstruction    . Nose surgery    . Cervical spine surgery    . Shoulder arthroscopy with rotator cuff repair      Lt shoulder    History reviewed. No pertinent family history.  Social History History  Substance Use Topics  . Smoking status: Never Smoker   . Smokeless tobacco: Never Used  . Alcohol Use: Yes    No Known Allergies  Current Outpatient Prescriptions  Medication Sig Dispense Refill  . loratadine (CLARITIN) 10 MG tablet Take 10 mg by mouth daily.       No current facility-administered medications for this visit.    Review of Systems Review of Systems  Constitutional: Negative.   HENT: Negative.   Eyes:       Conjunctivitis  Respiratory: Negative.   Cardiovascular: Negative.   Gastrointestinal:  Positive for abdominal pain. Negative for nausea, vomiting and diarrhea.       See history of present illness  Endocrine: Negative.   Genitourinary: Negative.   Allergic/Immunologic:       Seasonal allergies  Neurological: Negative.   Hematological: Negative.   Psychiatric/Behavioral: Negative.     Blood pressure 140/88, pulse 66, temperature 97.3 F (36.3 C), temperature source Oral, resp. rate 12, height 6\' 1"  (1.854 m), weight 297 lb (134.718 kg).  Physical Exam Physical Exam  Constitutional: He is oriented to person, place, and time. He appears well-developed and well-nourished.  HENT:  Head: Normocephalic and atraumatic.  Eyes: Pupils are equal, round, and reactive to light.  Bilateral  conjunctival injection  Neck: Normal range of motion. Neck supple.  Cardiovascular: Normal rate, normal heart sounds and intact distal pulses.   Pulmonary/Chest: Effort normal and breath sounds normal. No respiratory distress. He has no wheezes. He has no rales.  Abdominal: Soft. He exhibits mass. He exhibits no distension. There is no tenderness. There is no rebound and no guarding.    Ventral hernia several centimeters above the umbilicus, reduces, fascial defect feels to be 3-4 cm, no significant pain on reduction  Musculoskeletal: Normal range of motion.  Neurological: He is alert and oriented to  person, place, and time.  Skin: Skin is warm and dry.  Psychiatric: He has a normal mood and affect.    Data Reviewed Previous notes  Assessment    Ventral hernia, increasingly symptomatic    Plan    Again I have offered laparoscopic repair of this ventral hernia with mesh. Procedure, risks, and benefits were discussed in detail with the patient. This will require overnight stay.We also discussed lifting restrictions postoperatively with no lifting over 10 pounds for 6 weeks. 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 He  is agreeable.       Dakota Stangl E 05/19/2013, 9:30 AM

## 2013-06-01 ENCOUNTER — Encounter (HOSPITAL_COMMUNITY): Payer: Self-pay | Admitting: Pharmacist

## 2013-06-03 NOTE — Pre-Procedure Instructions (Signed)
RONAK DUQUETTE  06/03/2013   Your procedure is scheduled on:  Mon, Dec 29 @ 7:30 AM  Report to [facility] Short Stay [facility]trance A  at 5:30 AM.  Call this number if you have problems the morning of surgery: [phone]   Remember:   Do not eat food or drink liquids after midnight.   Take these medicines the morning of surgery with A SIP OF WATER: Claritin(Loratadine)              No Goody's,BC's,Aleve,Aspirin,Ibuprofen,Fish Oil,or any Herbal Medications   Do not wear jewelry  Do not wear lotions, powders, or colognes . You may wear deodorant.  Men may shave face and neck.  Do not bring valuables to the hospital.  [facility] is not responsible                  for any belongings or valuables.               Contacts, dentures or bridgework may not be worn into surgery.  Leave suitcase in the car. After surgery it may be brought to your room.  For patients admitted to the hospital, discharge time is determined by your                treatment team.               Patients discharged the day of surgery will not be allowed to drive  home.    Special Instructions: Shower using CHG 2 nights before surgery and the night before surgery.  If you shower the day of surgery use CHG.  Use special wash - you have one bottle of CHG for all showers.  You should use approximately 1/3 of the bottle for each shower.   Please read over the following fact sheets that you were given: Pain Booklet, Coughing and Deep Breathing and Surgical Site Infection Prevention

## 2013-06-04 ENCOUNTER — Encounter (HOSPITAL_COMMUNITY): Payer: Self-pay

## 2013-06-04 ENCOUNTER — Encounter (HOSPITAL_COMMUNITY)
Admission: RE | Admit: 2013-06-04 | Discharge: 2013-06-04 | Disposition: A | Discharge status: Home / Self Care | Payer: BC Managed Care – PPO | Source: Ambulatory Visit | Attending: General Surgery | Admitting: General Surgery

## 2013-06-04 DIAGNOSIS — Z01812 Encounter for preprocedural laboratory examination: Secondary | ICD-10-CM | POA: Insufficient documentation

## 2013-06-04 HISTORY — DX: Other specified postprocedural states: Z98.890

## 2013-06-04 HISTORY — DX: Personal history of urinary calculi: Z87.442

## 2013-06-04 HISTORY — DX: Headache: R51

## 2013-06-04 HISTORY — DX: Other seasonal allergic rhinitis: J30.2

## 2013-06-04 HISTORY — DX: Pneumonia, unspecified organism: J18.9

## 2013-06-04 HISTORY — DX: Pain in unspecified joint: M25.50

## 2013-06-04 HISTORY — DX: Unspecified hemorrhoids: K64.9

## 2013-06-04 HISTORY — DX: Nausea with vomiting, unspecified: R11.2

## 2013-06-04 HISTORY — DX: Unspecified asthma, uncomplicated: J45.909

## 2013-06-04 HISTORY — DX: Personal history of other diseases of the respiratory system: Z87.09

## 2013-06-04 LAB — BASIC METABOLIC PANEL
BUN: 22 mg/dL (ref 6–23)
CO2: 27 mEq/L (ref 19–32)
Calcium: 9.2 mg/dL (ref 8.4–10.5)
Chloride: 105 mEq/L (ref 96–112)
Creatinine, Ser: 0.86 mg/dL (ref 0.50–1.35)
GFR calc Af Amer: >90 mL/min (ref >90)
GFR calc non Af Amer: >90 mL/min (ref >90)
Glucose, Bld: 102 mg/dL — ABNORMAL HIGH (ref 70–99)
Potassium: 4.9 mEq/L (ref 3.5–5.1)
Sodium: 139 mEq/L (ref 135–145)

## 2013-06-04 LAB — CBC
HCT: 49.6 % (ref 39.0–52.0)
Hemoglobin: 16.6 g/dL (ref 13.0–17.0)
MCH: 29.8 pg (ref 26.0–34.0)
MCHC: 33.5 g/dL (ref 30.0–36.0)
MCV: 89 fL (ref 78.0–100.0)
Platelets: 212 10*3/uL (ref 150–400)
RBC: 5.57 MIL/uL (ref 4.22–5.81)
RDW: 13.4 % (ref 11.5–15.5)
WBC: 6.4 10*3/uL (ref 4.0–10.5)

## 2013-06-04 MED ORDER — CHLORHEXIDINE GLUCONATE 4 % EX LIQD: 1.0000 "application " | Freq: Once | CUTANEOUS | Status: DC

## 2013-06-04 NOTE — Progress Notes (Addendum)
Pt doesn't have a cardiologist  Denies ever having an echo or heart cath    Medical Md is Dr. Buren Kos  Stress test in epic from 2002    Sleep study done and to be requested from Dr.Shaw-uses CPAP

## 2013-06-13 MED ORDER — DEXTROSE 5 % IV SOLN
3.0000 g | INTRAVENOUS | Status: AC
  Administered 2013-06-14: 3 g via INTRAVENOUS
  Filled 2013-06-13: qty 3000

## 2013-06-14 ENCOUNTER — Encounter (HOSPITAL_COMMUNITY)
Admission: RE | Disposition: A | Discharge status: Home / Self Care | Payer: Self-pay | Source: Ambulatory Visit | Attending: General Surgery

## 2013-06-14 ENCOUNTER — Ambulatory Visit (HOSPITAL_COMMUNITY): Payer: BC Managed Care – PPO | Admitting: Anesthesiology

## 2013-06-14 ENCOUNTER — Observation Stay (HOSPITAL_COMMUNITY)
Admission: RE | Admit: 2013-06-14 | Discharge: 2013-06-16 | Disposition: A | Discharge status: Home / Self Care | Payer: BC Managed Care – PPO | Source: Ambulatory Visit | Attending: General Surgery | Admitting: General Surgery

## 2013-06-14 ENCOUNTER — Encounter (HOSPITAL_COMMUNITY): Payer: BC Managed Care – PPO | Admitting: Anesthesiology

## 2013-06-14 ENCOUNTER — Encounter (HOSPITAL_COMMUNITY): Payer: Self-pay | Admitting: *Deleted

## 2013-06-14 DIAGNOSIS — E669 Obesity, unspecified: Secondary | ICD-10-CM | POA: Insufficient documentation

## 2013-06-14 DIAGNOSIS — E785 Hyperlipidemia, unspecified: Secondary | ICD-10-CM | POA: Insufficient documentation

## 2013-06-14 DIAGNOSIS — R7989 Other specified abnormal findings of blood chemistry: Secondary | ICD-10-CM | POA: Insufficient documentation

## 2013-06-14 DIAGNOSIS — N529 Male erectile dysfunction, unspecified: Secondary | ICD-10-CM | POA: Insufficient documentation

## 2013-06-14 DIAGNOSIS — Z981 Arthrodesis status: Secondary | ICD-10-CM | POA: Insufficient documentation

## 2013-06-14 DIAGNOSIS — R030 Elevated blood-pressure reading, without diagnosis of hypertension: Secondary | ICD-10-CM | POA: Insufficient documentation

## 2013-06-14 DIAGNOSIS — G4733 Obstructive sleep apnea (adult) (pediatric): Secondary | ICD-10-CM | POA: Insufficient documentation

## 2013-06-14 DIAGNOSIS — K439 Ventral hernia without obstruction or gangrene: Principal | ICD-10-CM | POA: Insufficient documentation

## 2013-06-14 DIAGNOSIS — E8881 Metabolic syndrome: Secondary | ICD-10-CM | POA: Insufficient documentation

## 2013-06-14 DIAGNOSIS — Z8719 Personal history of other diseases of the digestive system: Secondary | ICD-10-CM

## 2013-06-14 HISTORY — PX: VENTRAL HERNIA REPAIR: SHX424

## 2013-06-14 HISTORY — PX: INSERTION OF MESH: SHX5868

## 2013-06-14 SURGERY — REPAIR, HERNIA, VENTRAL, LAPAROSCOPIC
Anesthesia: General | Site: Abdomen

## 2013-06-14 MED ORDER — LACTATED RINGERS IV SOLN
INTRAVENOUS | Status: DC | PRN
  Administered 2013-06-14 (×2): via INTRAVENOUS

## 2013-06-14 MED ORDER — BUPIVACAINE-EPINEPHRINE (PF) 0.25% -1:200000 IJ SOLN
INTRAMUSCULAR | Status: AC
  Filled 2013-06-14: qty 30

## 2013-06-14 MED ORDER — 0.9 % SODIUM CHLORIDE (POUR BTL) OPTIME
TOPICAL | Status: DC | PRN
  Administered 2013-06-14: 1000 mL

## 2013-06-14 MED ORDER — PROMETHAZINE HCL 25 MG/ML IJ SOLN: 12.5000 mg | INTRAMUSCULAR | Status: DC | PRN

## 2013-06-14 MED ORDER — ROCURONIUM BROMIDE 100 MG/10ML IV SOLN
INTRAVENOUS | Status: DC | PRN
  Administered 2013-06-14: 20 mg via INTRAVENOUS
  Administered 2013-06-14: 50 mg via INTRAVENOUS

## 2013-06-14 MED ORDER — ONDANSETRON HCL 4 MG PO TABS: 4.0000 mg | ORAL_TABLET | Freq: Four times a day (QID) | ORAL | Status: DC | PRN

## 2013-06-14 MED ORDER — KETOROLAC TROMETHAMINE 30 MG/ML IJ SOLN
INTRAMUSCULAR | Status: AC
  Filled 2013-06-14: qty 1

## 2013-06-14 MED ORDER — GLYCOPYRROLATE 0.2 MG/ML IJ SOLN
INTRAMUSCULAR | Status: DC | PRN
  Administered 2013-06-14: .8 mg via INTRAVENOUS

## 2013-06-14 MED ORDER — EPHEDRINE SULFATE 50 MG/ML IJ SOLN
INTRAMUSCULAR | Status: DC | PRN
  Administered 2013-06-14: 10 mg via INTRAVENOUS

## 2013-06-14 MED ORDER — ONDANSETRON HCL 4 MG/2ML IJ SOLN
INTRAMUSCULAR | Status: DC | PRN
  Administered 2013-06-14: 4 mg via INTRAVENOUS

## 2013-06-14 MED ORDER — HYDROMORPHONE HCL PF 1 MG/ML IJ SOLN
0.2500 mg | INTRAMUSCULAR | Status: DC | PRN
  Administered 2013-06-14: 0.5 mg via INTRAVENOUS
  Administered 2013-06-14 (×2): 0.25 mg via INTRAVENOUS
  Administered 2013-06-14: 0.5 mg via INTRAVENOUS

## 2013-06-14 MED ORDER — HYDROMORPHONE HCL PF 1 MG/ML IJ SOLN
INTRAMUSCULAR | Status: AC
  Filled 2013-06-14: qty 1

## 2013-06-14 MED ORDER — HEPARIN SODIUM (PORCINE) 5000 UNIT/ML IJ SOLN
5000.0000 [IU] | Freq: Three times a day (TID) | INTRAMUSCULAR | Status: DC
  Administered 2013-06-15 – 2013-06-16 (×4): 5000 [IU] via SUBCUTANEOUS
  Filled 2013-06-14 (×6): qty 1

## 2013-06-14 MED ORDER — OXYCODONE HCL 5 MG PO TABS
5.0000 mg | ORAL_TABLET | ORAL | Status: DC | PRN
  Administered 2013-06-15 – 2013-06-16 (×4): 10 mg via ORAL
  Filled 2013-06-14 (×4): qty 2

## 2013-06-14 MED ORDER — LORATADINE 10 MG PO TABS
10.0000 mg | ORAL_TABLET | Freq: Every day | ORAL | Status: DC
  Administered 2013-06-15 – 2013-06-16 (×2): 10 mg via ORAL
  Filled 2013-06-14 (×2): qty 1

## 2013-06-14 MED ORDER — FENTANYL CITRATE 0.05 MG/ML IJ SOLN
INTRAMUSCULAR | Status: DC | PRN
  Administered 2013-06-14 (×2): 50 ug via INTRAVENOUS
  Administered 2013-06-14: 150 ug via INTRAVENOUS

## 2013-06-14 MED ORDER — KCL IN DEXTROSE-NACL 20-5-0.45 MEQ/L-%-% IV SOLN
INTRAVENOUS | Status: DC
Start: 2013-06-14 — End: 2013-06-16
  Administered 2013-06-14 (×2): via INTRAVENOUS
  Filled 2013-06-14 (×3): qty 1000

## 2013-06-14 MED ORDER — KETOROLAC TROMETHAMINE 30 MG/ML IJ SOLN
15.0000 mg | Freq: Once | INTRAMUSCULAR | Status: AC | PRN
  Administered 2013-06-14: 30 mg via INTRAVENOUS

## 2013-06-14 MED ORDER — MIDAZOLAM HCL 5 MG/5ML IJ SOLN
INTRAMUSCULAR | Status: DC | PRN
  Administered 2013-06-14: 2 mg via INTRAVENOUS

## 2013-06-14 MED ORDER — PROPOFOL 10 MG/ML IV BOLUS
INTRAVENOUS | Status: DC | PRN
  Administered 2013-06-14: 200 mg via INTRAVENOUS

## 2013-06-14 MED ORDER — NEOSTIGMINE METHYLSULFATE 1 MG/ML IJ SOLN
INTRAMUSCULAR | Status: DC | PRN
  Administered 2013-06-14: 4 mg via INTRAVENOUS

## 2013-06-14 MED ORDER — OXYCODONE HCL 5 MG PO TABS: 5.0000 mg | ORAL_TABLET | Freq: Once | ORAL | Status: DC | PRN

## 2013-06-14 MED ORDER — BUPIVACAINE-EPINEPHRINE 0.25% -1:200000 IJ SOLN
INTRAMUSCULAR | Status: DC | PRN
  Administered 2013-06-14: 23 mL

## 2013-06-14 MED ORDER — OXYCODONE HCL 5 MG/5ML PO SOLN
5.0000 mg | Freq: Once | ORAL | Status: DC | PRN
Start: 2013-06-14 — End: 2013-06-14

## 2013-06-14 MED ORDER — CYCLOSPORINE 0.05 % OP EMUL: 1.0000 [drp] | Freq: Two times a day (BID) | OPHTHALMIC | Status: DC

## 2013-06-14 MED ORDER — LIDOCAINE HCL (CARDIAC) 20 MG/ML IV SOLN
INTRAVENOUS | Status: DC | PRN
  Administered 2013-06-14: 100 mg via INTRAVENOUS

## 2013-06-14 MED ORDER — ONDANSETRON HCL 4 MG/2ML IJ SOLN: 4.0000 mg | Freq: Once | INTRAMUSCULAR | Status: DC | PRN

## 2013-06-14 MED ORDER — HYDROMORPHONE HCL PF 1 MG/ML IJ SOLN
1.0000 mg | INTRAMUSCULAR | Status: DC | PRN
  Administered 2013-06-14 – 2013-06-15 (×2): 2 mg via INTRAVENOUS
  Filled 2013-06-14 (×2): qty 2

## 2013-06-14 MED ORDER — KCL IN DEXTROSE-NACL 20-5-0.45 MEQ/L-%-% IV SOLN
INTRAVENOUS | Status: AC
  Filled 2013-06-14: qty 1000

## 2013-06-14 MED ORDER — ONDANSETRON HCL 4 MG/2ML IJ SOLN: 4.0000 mg | Freq: Four times a day (QID) | INTRAMUSCULAR | Status: DC | PRN

## 2013-06-14 MED ORDER — ACETAMINOPHEN 325 MG PO TABS: 650.0000 mg | ORAL_TABLET | ORAL | Status: DC | PRN

## 2013-06-14 SURGICAL SUPPLY — 59 items
ADH SKN CLS APL DERMABOND .7 (GAUZE/BANDAGES/DRESSINGS) ×2
APPLIER CLIP 5 13 M/L LIGAMAX5 (MISCELLANEOUS)
APPLIER CLIP ROT 10 11.4 M/L (STAPLE)
APR CLP MED LRG 11.4X10 (STAPLE)
APR CLP MED LRG 5 ANG JAW (MISCELLANEOUS)
BINDER ABD UNIV 12 45-62 (WOUND CARE) IMPLANT
BINDER ABDOMINAL 46IN 62IN (WOUND CARE) ×2
BLADE SURG ROTATE 9660 (MISCELLANEOUS) ×1 IMPLANT
CANISTER SUCTION 2500CC (MISCELLANEOUS) ×1 IMPLANT
CHLORAPREP W/TINT 26ML (MISCELLANEOUS) ×2 IMPLANT
CLIP APPLIE 5 13 M/L LIGAMAX5 (MISCELLANEOUS) IMPLANT
CLIP APPLIE ROT 10 11.4 M/L (STAPLE) IMPLANT
COVER SURGICAL LIGHT HANDLE (MISCELLANEOUS) ×2 IMPLANT
DERMABOND ADVANCED (GAUZE/BANDAGES/DRESSINGS) ×2
DERMABOND ADVANCED .7 DNX12 (GAUZE/BANDAGES/DRESSINGS) ×1 IMPLANT
DEVICE SECURE STRAP 25 ABSORB (INSTRUMENTS) ×3 IMPLANT
DEVICE TROCAR PUNCTURE CLOSURE (ENDOMECHANICALS) ×2 IMPLANT
DRAPE UTILITY 15X26 W/TAPE STR (DRAPE) ×4 IMPLANT
ELECT REM PT RETURN 9FT ADLT (ELECTROSURGICAL) ×2
ELECTRODE REM PT RTRN 9FT ADLT (ELECTROSURGICAL) ×1 IMPLANT
FILTER SMOKE EVAC LAPAROSHD (FILTER) IMPLANT
GLOVE BIO SURGEON STRL SZ7.5 (GLOVE) ×1 IMPLANT
GLOVE BIO SURGEON STRL SZ8 (GLOVE) ×2 IMPLANT
GLOVE BIOGEL PI IND STRL 6.5 (GLOVE) IMPLANT
GLOVE BIOGEL PI IND STRL 7.5 (GLOVE) IMPLANT
GLOVE BIOGEL PI IND STRL 8 (GLOVE) ×1 IMPLANT
GLOVE BIOGEL PI INDICATOR 6.5 (GLOVE) ×3
GLOVE BIOGEL PI INDICATOR 7.5 (GLOVE) ×1
GLOVE BIOGEL PI INDICATOR 8 (GLOVE) ×1
GLOVE SURG SS PI 6.5 STRL IVOR (GLOVE) ×1 IMPLANT
GOWN STRL NON-REIN LRG LVL3 (GOWN DISPOSABLE) ×4 IMPLANT
GOWN STRL REIN XL XLG (GOWN DISPOSABLE) ×2 IMPLANT
KIT BASIN OR (CUSTOM PROCEDURE TRAY) ×2 IMPLANT
KIT ROOM TURNOVER OR (KITS) ×2 IMPLANT
MARKER SKIN DUAL TIP RULER LAB (MISCELLANEOUS) ×2 IMPLANT
MESH VENTRALIGHT ST 8X10 (Mesh General) ×1 IMPLANT
NDL SPNL 22GX3.5 QUINCKE BK (NEEDLE) ×1 IMPLANT
NEEDLE 22X1 1/2 (OR ONLY) (NEEDLE) ×2 IMPLANT
NEEDLE SPNL 22GX3.5 QUINCKE BK (NEEDLE) ×2 IMPLANT
NS IRRIG 1000ML POUR BTL (IV SOLUTION) ×2 IMPLANT
PAD ARMBOARD 7.5X6 YLW CONV (MISCELLANEOUS) ×4 IMPLANT
SCALPEL HARMONIC ACE (MISCELLANEOUS) ×1 IMPLANT
SCISSORS LAP 5X35 DISP (ENDOMECHANICALS) ×2 IMPLANT
SET IRRIG TUBING LAPAROSCOPIC (IRRIGATION / IRRIGATOR) ×1 IMPLANT
SLEEVE ENDOPATH XCEL 5M (ENDOMECHANICALS) ×2 IMPLANT
SUT PROLENE 0 CT 1 CR/8 (SUTURE) ×2 IMPLANT
SUT VIC AB 4-0 PS2 27 (SUTURE) ×2 IMPLANT
SUT VICRYL 0 TIES 12 18 (SUTURE) IMPLANT
TAPE STRIPS DRAPE STRL (GAUZE/BANDAGES/DRESSINGS) ×1 IMPLANT
TOWEL OR 17X24 6PK STRL BLUE (TOWEL DISPOSABLE) ×1 IMPLANT
TOWEL OR 17X26 10 PK STRL BLUE (TOWEL DISPOSABLE) ×2 IMPLANT
TRAY FOLEY CATH 16FR SILVER (SET/KITS/TRAYS/PACK) ×2 IMPLANT
TRAY LAPAROSCOPIC (CUSTOM PROCEDURE TRAY) ×2 IMPLANT
TROCAR 12M 150ML BLUNT (TROCAR) ×1 IMPLANT
TROCAR 5M 150ML BLDLS (TROCAR) ×3 IMPLANT
TROCAR XCEL BLUNT TIP 100MML (ENDOMECHANICALS) IMPLANT
TROCAR XCEL NON-BLD 11X100MML (ENDOMECHANICALS) ×1 IMPLANT
TROCAR XCEL NON-BLD 5MMX100MML (ENDOMECHANICALS) ×2 IMPLANT
WATER STERILE IRR 1000ML POUR (IV SOLUTION) IMPLANT

## 2013-06-14 NOTE — Interval H&P Note (Signed)
History and Physical Interval Note:  06/14/2013 6:16 AM  Jerome Kline  has presented today for surgery, with the diagnosis of ventral hernia  The various methods of treatment have been discussed with the patient and family. After consideration of risks, benefits and other options for treatment, the patient has consented to  Procedure(s): LAPAROSCOPIC VENTRAL HERNIA (N/A) INSERTION OF MESH (N/A) as a surgical intervention .  The patient's history has been reviewed, patient re-examined, no change in status, stable for surgery.  I have reviewed the patient's chart and labs.  Questions were answered to the patient's satisfaction.     Colton Engdahl E

## 2013-06-14 NOTE — Addendum Note (Signed)
Addendum created 06/14/13 1128 by Whitman Hero, CRNA   Modules edited: Anesthesia Flowsheet

## 2013-06-14 NOTE — Anesthesia Preprocedure Evaluation (Signed)
Anesthesia Evaluation  Patient identified by MRN, date of birth, ID band Patient awake    Reviewed: Allergy & Precautions, H&P , NPO status , Patient's Chart, lab work & pertinent test results  Airway Mallampati: II TM Distance: >3 FB Neck ROM: Full    Dental  (+) Teeth Intact and Dental Advisory Given   Pulmonary  breath sounds clear to auscultation        Cardiovascular Rhythm:Regular Rate:Normal     Neuro/Psych    GI/Hepatic   Endo/Other    Renal/GU      Musculoskeletal   Abdominal (+) + obese,   Peds  Hematology   Anesthesia Other Findings   Reproductive/Obstetrics                           Anesthesia Physical Anesthesia Plan  ASA: III  Anesthesia Plan: General   Post-op Pain Management:    Induction: Intravenous  Airway Management Planned: Oral ETT  Additional Equipment:   Intra-op Plan:   Post-operative Plan: Extubation in OR  Informed Consent: I have reviewed the patients History and Physical, chart, labs and discussed the procedure including the risks, benefits and alternatives for the proposed anesthesia with the patient or authorized representative who has indicated his/her understanding and acceptance.   Dental advisory given  Plan Discussed with: CRNA and Anesthesiologist  Anesthesia Plan Comments: (Ventral Hernia Obesity Sleep apnea on CPAP Allergic Rhinitis  Plan GA with oral ETT  Kipp Brood, MD)        Anesthesia Quick Evaluation

## 2013-06-14 NOTE — Anesthesia Procedure Notes (Addendum)
Procedure Name: Intubation Date/Time: 06/14/2013 7:36 AM Performed by: Whitman Hero Pre-anesthesia Checklist: Patient identified, Emergency Drugs available, Suction available and Patient being monitored Patient Re-evaluated:Patient Re-evaluated prior to inductionOxygen Delivery Method: Circle system utilized Preoxygenation: Pre-oxygenation with 100% oxygen Intubation Type: IV induction Ventilation: Two handed mask ventilation required, Oral airway inserted - appropriate to patient size and Mask ventilation without difficulty Laryngoscope Size: Mac and 4 Grade View: Grade II Tube type: Oral Tube size: 7.5 mm Number of attempts: 1 Airway Equipment and Method: Stylet Placement Confirmation: ETT inserted through vocal cords under direct vision,  breath sounds checked- equal and bilateral and positive ETCO2 Secured at: 23 cm Tube secured with: Tape Dental Injury: Teeth and Oropharynx as per pre-operative assessment

## 2013-06-14 NOTE — Op Note (Signed)
06/14/2013  9:08 AM  PATIENT:  Colonel Bald  47 y.o. male  PRE-OPERATIVE DIAGNOSIS:  VENTRAL HERNIA  POST-OPERATIVE DIAGNOSIS:  VENTRAL HERNIA  PROCEDURE:  Procedure(s): LAPAROSCOPIC VENTRAL HERNIA INSERTION OF MESH 20X25CM VENTRALIGHT  SURGEON:  Surgeon(s): Liz Malady, MD  ASSISTANTS: none   ANESTHESIA:   local and general  EBL:  Total I/O In: 1000 [I.V.:1000] Out: -   BLOOD ADMINISTERED:none  DRAINS: none   SPECIMEN:  No Specimen  DISPOSITION OF SPECIMEN:  N/A  COUNTS:  YES  DICTATION: .Dragon Dictation  Patient presents for laparoscopic repair of ventral hernia. He received intravenous antibiotics. Informed consent was obtained. He was brought to the operating room. General endotracheal anesthesia was administered by the anesthesia staff. Foley catheter was placed by nursing. Abdomen was prepped and draped in sterile fashion. Time out procedure was performed. A spot on the left upper quadrant along the costal margin was infiltrated with local. Optiview 5 mm port was inserted. Abdomen was insufflated with carbon dioxide in standard fashion. There were no complications from insertion. The port was barely long enough to reach into his abdomen, however, so we obtained bariatric length ports. Under direct vision, a 11 mm port was placed in the left lower quadrant. The left upper quadrant port was changed out to a longer length. 2 5 mm ports were placed in the right upper quadrant right lower quadrant under direct vision. The hernia was inspected. It contained only omentum. This was gradually and gently reduced. Hemostasis was obtained with cautery on the omentum. The hernia defect was small. It began a few centimeters cephalad to the umbilicus. In light of the week area at the umbilicus, decision was made to cover the hernia defect with over 4 cm in length and additionally to cover down over the umbilicus. I chose a 25 x 20 cm ventralight mesh.This was marked for anatomic  orientation and sutured in 6 places 0 Prolene. It was rolled up and inserted into the abdomen via the 11 mm port. It was unfurled. Orientation was adjusted. Next, small stab wounds were made at each of the 6 sites in an Endo Catch was used to grasp the sutures with each strand grasped separately. These were all tied bringing the mesh up against the abdominal wall. 2 concentric rings of secure strap tacks were placed. The mesh laid nicely flat. 4 quadrant inspection was then done. A small bleeder on the omentum was cauterized. There were no other complicating features noted. Pneumoperitoneum was released. Wounds were irrigated. Port sites were closed with running 4 Vicryl subcuticular followed by Dermabond. 6 small stab wounds were closed with Dermabond. All counts were correct. Patient tolerated the procedure well without apparent complication. Abdominal binder was applied. He was taken recovery in stable condition.  PATIENT DISPOSITION:  PACU - hemodynamically stable.   Delay start of Pharmacological VTE agent (>24hrs) due to surgical blood loss or risk of bleeding:  no  Violeta Gelinas, MD, MPH, FACS Pager: [phone]  12/29/20149:08 AM

## 2013-06-14 NOTE — Transfer of Care (Signed)
Immediate Anesthesia Transfer of Care Note  Patient: Jerome Kline  Procedure(s) Performed: Procedure(s): LAPAROSCOPIC VENTRAL HERNIA (N/A) INSERTION OF MESH (N/A)  Patient Location: PACU  Anesthesia Type:General  Level of Consciousness: sedated and patient cooperative  Airway & Oxygen Therapy: Patient Spontanous Breathing and Patient connected to face mask oxygen  Post-op Assessment: Report given to PACU RN and Post -op Vital signs reviewed and stable  Post vital signs: Reviewed and stable  Complications: No apparent anesthesia complications

## 2013-06-14 NOTE — H&P (View-Only) (Signed)
Patient ID: Jerome Kline, male   DOB: March 08, 1966, 47 y.o.   MRN: [id number]  Chief Complaint  Patient presents with  . Incisional Hernia    HPI Jerome Kline is a 47 y.o. male.  Chief complaint: Ventral hernia HPI I saw him last year regarding this ventral hernia. We discussed laparoscopic repair at that time. He held off due to his schedule. In the interim, he claims hernias and a bit larger. He has lost some weight and it is more noticeable. It also bothers him intermittently with some mild discomfort. Also gives him some difficulty when he exercises. He returns for reevaluation and scheduling.Of note, he is currently being treated for conjunctivitis by his primary care physician, Dr. Clelia Croft. Past Medical History  Diagnosis Date  . Hernia   . Obesity   . Metabolic syndrome   . OSA (obstructive sleep apnea)     on CPAP  . Nephrolithiasis   . Dyslipidemia   . Elevated BP   . Elevated LFTs   . ED (erectile dysfunction)   . IGT (impaired glucose tolerance)   . Scrotal cyst     Past Surgical History  Procedure Laterality Date  . Cholecystectomy  1998    Lap Chole  . Nose surgery    . Cervical fusion  12/10    C4-5 and C6-7  . Knee arthroscopy w/ acl reconstruction    . Nose surgery    . Cervical spine surgery    . Shoulder arthroscopy with rotator cuff repair      Lt shoulder    History reviewed. No pertinent family history.  Social History History  Substance Use Topics  . Smoking status: Never Smoker   . Smokeless tobacco: Never Used  . Alcohol Use: Yes    No Known Allergies  Current Outpatient Prescriptions  Medication Sig Dispense Refill  . loratadine (CLARITIN) 10 MG tablet Take 10 mg by mouth daily.       No current facility-administered medications for this visit.    Review of Systems Review of Systems  Constitutional: Negative.   HENT: Negative.   Eyes:       Conjunctivitis  Respiratory: Negative.   Cardiovascular: Negative.   Gastrointestinal:  Positive for abdominal pain. Negative for nausea, vomiting and diarrhea.       See history of present illness  Endocrine: Negative.   Genitourinary: Negative.   Allergic/Immunologic:       Seasonal allergies  Neurological: Negative.   Hematological: Negative.   Psychiatric/Behavioral: Negative.     Blood pressure 140/88, pulse 66, temperature 97.3 F (36.3 C), temperature source Oral, resp. rate 12, height 6\' 1"  (1.854 m), weight 297 lb (134.718 kg).  Physical Exam Physical Exam  Constitutional: He is oriented to person, place, and time. He appears well-developed and well-nourished.  HENT:  Head: Normocephalic and atraumatic.  Eyes: Pupils are equal, round, and reactive to light.  Bilateral  conjunctival injection  Neck: Normal range of motion. Neck supple.  Cardiovascular: Normal rate, normal heart sounds and intact distal pulses.   Pulmonary/Chest: Effort normal and breath sounds normal. No respiratory distress. He has no wheezes. He has no rales.  Abdominal: Soft. He exhibits mass. He exhibits no distension. There is no tenderness. There is no rebound and no guarding.    Ventral hernia several centimeters above the umbilicus, reduces, fascial defect feels to be 3-4 cm, no significant pain on reduction  Musculoskeletal: Normal range of motion.  Neurological: He is alert and oriented to  person, place, and time.  Skin: Skin is warm and dry.  Psychiatric: He has a normal mood and affect.    Data Reviewed Previous notes  Assessment    Ventral hernia, increasingly symptomatic    Plan    Again I have offered laparoscopic repair of this ventral hernia with mesh. Procedure, risks, and benefits were discussed in detail with the patient. This will require overnight stay.We also discussed lifting restrictions postoperatively with no lifting over 10 pounds for 6 weeks. 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 He  is agreeable.       Dain Laseter E 05/19/2013, 9:30 AM    

## 2013-06-14 NOTE — Anesthesia Postprocedure Evaluation (Signed)
Anesthesia Post Note  Patient: Jerome Kline  Procedure(s) Performed: Procedure(s) (LRB): LAPAROSCOPIC VENTRAL HERNIA (N/A) INSERTION OF MESH (N/A)  Anesthesia type: General  Patient location: PACU  Post pain: Pain level controlled and Adequate analgesia  Post assessment: Post-op Vital signs reviewed, Patient's Cardiovascular Status Stable, Respiratory Function Stable, Patent Airway and Pain level controlled  Last Vitals:  Filed Vitals:   06/14/13 1000  BP:   Pulse: 91  Temp:   Resp: 11    Post vital signs: Reviewed and stable  Level of consciousness: awake, alert  and oriented  Complications: No apparent anesthesia complications

## 2013-06-15 ENCOUNTER — Encounter (HOSPITAL_COMMUNITY): Payer: Self-pay | Admitting: General Surgery

## 2013-06-15 NOTE — Progress Notes (Signed)
Patient ID: Jerome Kline, male   DOB: Mar 11, 1966, 47 y.o.   MRN: [id number] No flatus and still a lot of pain. Will keep until tomorrow. Violeta Gelinas, MD, MPH, FACS Pager: [phone]

## 2013-06-15 NOTE — Progress Notes (Signed)
1 Day Post-Op  Subjective: CPAP overnight, has had only IV pain meds  Objective: Vital signs in last 24 hours: Temp:  [97.2 F (36.2 C)-98.5 F (36.9 C)] 97.8 F (36.6 C) (12/30 0524) Pulse Rate:  [74-110] 74 (12/30 0524) Resp:  [10-22] 18 (12/30 0524) BP: (106-167)/(64-101) 150/77 mmHg (12/30 0524) SpO2:  [90 %-98 %] 97 % (12/30 0524) Weight:  [304 lb 3.2 oz (137.984 kg)] 304 lb 3.2 oz (137.984 kg) (12/29 2054) Last BM Date: 06/13/13  Intake/Output from previous day: 12/29 0701 - 12/30 0700 In: 4775 [P.O.:1260; I.V.:3515] Out: 1450 [Urine:1450] Intake/Output this shift:    General appearance: alert and cooperative Resp: clear to auscultation bilaterally Cardio: regular rate and rhythm GI: soft, incisiond CDI  Lab Results:  No results found for this basename: WBC, HGB, HCT, PLT,  in the last 72 hours BMET No results found for this basename: NA, K, CL, CO2, GLUCOSE, BUN, CREATININE, CALCIUM,  in the last 72 hours PT/INR No results found for this basename: LABPROT, INR,  in the last 72 hours ABG No results found for this basename: PHART, PCO2, PO2, HCO3,  in the last 72 hours  Studies/Results: No results found.  Anti-infectives: Anti-infectives   Start     Dose/Rate Route Frequency Ordered Stop   06/14/13 0600  ceFAZolin (ANCEF) 3 g in dextrose 5 % 50 mL IVPB     3 g 160 mL/hr over 30 Minutes Intravenous On call to O.R. 06/13/13 1334 06/14/13 0742      Assessment/Plan: s/p Procedure(s): LAPAROSCOPIC VENTRAL HERNIA (N/A) INSERTION OF MESH (N/A) POD#1 Mobilize Oral pain meds Advance diet Possible D/C this PM  LOS: 1 day    Danel Studzinski E 06/15/2013

## 2013-06-15 NOTE — Progress Notes (Signed)
Pt places self on/off cpap when needed.

## 2013-06-16 MED ORDER — OXYCODONE HCL 5 MG PO TABS: 5.0000 mg | ORAL_TABLET | ORAL | Status: DC | PRN

## 2013-06-16 NOTE — Discharge Summary (Signed)
Physician Discharge Summary  Patient ID: Jerome Kline MRN: [id number] DOB/AGE: [birth year] 47 y.o.  Admit date: 06/14/2013 Discharge date: 06/16/2013  Admission Diagnoses:ventral hernia    Discharge Diagnoses: S/P laparoscopic repair ventral hernia with mesh Active Problems:   S/P laparoscopic hernia repair   Discharged Condition: good  Hospital Course: patient underwent laparoscopic repair of ventral hernia with mesh. He had gradual bowel function return and pain was controlled with oral meds by POD#2. He wore his BiPAP at HS. He is stable for D/C today.  Consults: None  Significant Diagnostic Studies: none  Treatments: surgery: above  Discharge Exam: Blood pressure 150/85, pulse 85, temperature 97.9 F (36.6 C), temperature source Oral, resp. rate 20, height 6\' 1"  (1.854 m), weight 304 lb 3.2 oz (137.984 kg), SpO2 99.00%. General appearance: alert and cooperative Resp: clear to auscultation bilaterally Cardio: regular rate and rhythm GI: soft, incisions CDI  Disposition: 01-Home or Self Care  Discharge Orders   Future Appointments Provider Department Dept Phone   06/22/2013 10:30 AM Melvyn Novas, MD [facility] [phone]   Future Orders Complete By Expires   Diet - low sodium heart healthy  As directed    Discharge instructions  As directed    Comments:     You may shower. Wear the binder as much as possible - especially for the first two weeks. No lifting over 10lbs.   Increase activity slowly  As directed    Lifting restrictions  As directed    Comments:     No lifting over 10lbs for 6 weeks   No dressing needed  As directed        Medication List         cycloSPORINE 0.05 % ophthalmic emulsion  Commonly known as:  RESTASIS  Place 1 drop into both eyes 2 (two) times daily. Started on 05/24/13. Stops on 06/08/13.     Fish Oil 1000 MG Caps  Take 1,000 mg by mouth daily.     loratadine 10 MG tablet  Commonly known as:  CLARITIN   Take 10 mg by mouth daily.     oxyCODONE 5 MG immediate release tablet  Commonly known as:  Oxy IR/ROXICODONE  Take 1-2 tablets (5-10 mg total) by mouth every 4 (four) hours as needed (pain).     PRESCRIPTION MEDICATION  Apply 1 application topically 2 (two) times daily. 0.2% Nitroglycerin ointment. Apply to perianal area/hemorrhoids.   Used for hemorrhoids.     TRILIPIX 135 MG capsule  Generic drug:  Choline Fenofibrate  Take 135 mg by mouth daily.           Follow-up Information   Follow up with Liz Malady, MD. (my nurse will call you with an appointment)    Specialty:  General Surgery   Contact information:   [address] [place] [address] [phone]       Signed: Liz Malady 06/16/2013, 7:38 AM

## 2013-06-22 ENCOUNTER — Ambulatory Visit (INDEPENDENT_AMBULATORY_CARE_PROVIDER_SITE_OTHER): Payer: BC Managed Care – PPO | Admitting: Neurology

## 2013-06-22 ENCOUNTER — Encounter: Payer: Self-pay | Admitting: Neurology

## 2013-06-22 VITALS — BP 135/88 | HR 83 | Resp 18 | Ht 73.0 in | Wt 303.0 lb

## 2013-06-22 DIAGNOSIS — Z9989 Dependence on other enabling machines and devices: Principal | ICD-10-CM

## 2013-06-22 DIAGNOSIS — G4733 Obstructive sleep apnea (adult) (pediatric): Secondary | ICD-10-CM | POA: Insufficient documentation

## 2013-06-22 DIAGNOSIS — E669 Obesity, unspecified: Secondary | ICD-10-CM | POA: Insufficient documentation

## 2013-06-22 MED ORDER — MOMETASONE FUROATE 50 MCG/ACT NA SUSP: 2.0000 | Freq: Every day | NASAL | Status: DC

## 2013-06-22 NOTE — Progress Notes (Signed)
[facility]  Provider:  Larey Kline, M D  Referring Provider: Janalyn Rouse, MD Primary Care Physician:  Jerome Rouse, MD  Chief Complaint  Patient presents with  . sleep consult    NP,Rm 10    HPI:  Jerome Kline is a 48 y.o. male  Is seen here as a referral   from Dr. Brigitte Kline for a sleep medicine consultation.    Jerome Kline  was originally referred in March 2013 for a polysomnography study- at [facility] sleep . At the time the patient was 48 years old, a right-handed, Caucasian, married male with a past medical history of obesity, hypertriglyceridemia, hypertension, borderline diabetes, fatigue and hypogonadism. His sleep complaints that included insomnia fatigue and loud witnessed snoring. The patient had and Jerome Kline only at 1 point and had nor points and list on the backs inventory. His BMI at that time was 43.0. He had allergic rhinitis and took Claritin when necessary. The study documented an AHI of 61.2 and an RDI of 75.5 the REM AHI was 76.4 in the supine AHI was 81.4. The desaturation index of 3% or more for oxygen per hour was 72.7.  The 02 nadir was 76% with a total time of 72 minutes with low oxygen levels.  The patient was titrated to 10 cm CPAP with a resulting AHI of 0 the oxygen nadir rules to 85% at the time and desaturation was now only 10.9 minutes. The average heart rate was in normal sinus rhythm. The patient used a fullface mask during the sleep study which had been his choice given that he had  numerous sinus and ENT surgeries.  [name] reports today that he has used the CPAP machine faithfully for or 20 months now, and  Learnt only now that his DME ([employer]) v can not provide parts without a doctors visit.   An in office download was obtained for data between October 2013 and January 5th 2015 (a very long period of time)- the patient's medium daily usage of CPAP is 7 hours 28 minutes , also he does not necessarily uses  machine every day of the week,  as he travels.  The average daily usage still is 4 hours and 16 minutes and fulfills CMS criteria.  The residual AHI is 0.8 in the machine is set at 10 cm water with an EPR of 2.  The patient reports that he was not aware whole fatigue and truly lacking energy he was before the sleep study was done and CPAP was initiated. His blood sugars have normalized his blood pressure is lower his heart rate is more regular according to Dr. Raul Kline office notes. He also lost about 20 pounds body weight since being on CPAP. He endorses the Epworth Sleepiness Kline at 2 points and the fatigue severity score at 14 points per liter much lower than previously.   The patient is knowledge of and he describes his bedtime routine as follows. Bedtime is anywhere between 10 PM and midnight, he falls asleep promptly, he will have one or 2 bathroom break at night, but falls asleep promptly again. In the morning he normally rises at about 5:30 Am, tries to [place]. Some days of the week he can work whole and rises at 8 AM. The average nocturnal time in sleep is about 7 hours. He does not use caffeine at the beverages in the morning, he will drink iced tea for lunch and before supper. Rarely will he drinks  caffeinated sodas. He is a nonsmoker, very rarely drinks alcohol.  The patient has turbinate and adenoid surgery as well as a nasal septal plasty. These were meant to help him prevent sinusitis and also allow him to breathe unhindered through the nasal passage  This has not been entirely successful. He has had extensive fusion anterior access, to the  neck  level C5-6-7-  he has a mild retrognathia.     Review of Systems: Out of a complete 14 system review, the patient complains of only the following symptoms, and all other reviewed systems are negative. epworth 2 , FSS 12 .  Review of systems today:  he did not endorse any significant problems with breathing in sleep and he feels that these  have been addressed sufficiently with the CPAP.  He has almost no residual apneas. Currently he suffers from a acute rhinitis and probably some sinusitis and bronchitis. The fullface mask a little more comfortable for him as he is a mouth breather. He also has facial hair. He took his mask to the hospital for his recent hernia repair surgery.     History   Social History  . Marital Status: Married    Spouse Name: Jerome Kline    Number of Children: 1  . Years of Education: college   Occupational History  . [employer]    Social History Main Topics  . Smoking status: Never Smoker   . Smokeless tobacco: Never Used  . Alcohol Use: No  . Drug Use: No  . Sexual Activity: Yes   Other Topics Concern  . Not on file   Social History Narrative  . No narrative on file    Family History  Problem Relation Age of Onset  . Heart disease Maternal Grandmother   . Heart disease Paternal Grandfather     Past Medical History  Diagnosis Date  . Hernia     ventral  . OSA (obstructive sleep apnea)     on CPAP  . Dyslipidemia     takes Trilipix daily  . ED (erectile dysfunction)   . Seasonal allergies     takes Claritin daily  . PONV (postoperative nausea and vomiting)   . Asthma     allergy induced asthma but doesn't use inhaler  . Pneumonia     last time about 42yrs ago  . History of bronchitis   . Headache(784.0)     occasionally  . Joint pain     knees/hips/shoulders  . Hemorrhoids   . History of kidney stones     Past Surgical History  Procedure Laterality Date  . Cholecystectomy  1998    Lap Chole  . Nose surgery    . Cervical fusion  12/10    C4-5 and C6-7  . Knee arthroscopy w/ acl reconstruction    . Nose surgery    . Cervical spine surgery    . Shoulder arthroscopy with rotator cuff repair      Lt shoulder  . Myringotomy      x 2  . Adenoidectomy    . Colonoscopy    . Esophagogastroduodenoscopy    . Ventral hernia repair N/A 06/14/2013     Procedure: LAPAROSCOPIC VENTRAL HERNIA;  Surgeon: Zenovia Jarred, MD;  Location: [place];  Service: General;  Laterality: N/A;  . Insertion of mesh N/A 06/14/2013    Procedure: INSERTION OF MESH;  Surgeon: Zenovia Jarred, MD;  Location: [place];  Service: General;  Laterality: N/A;  Current Outpatient Prescriptions  Medication Sig Dispense Refill  . Choline Fenofibrate (TRILIPIX) 135 MG capsule Take 135 mg by mouth daily.      Marland Kitchen loratadine (CLARITIN) 10 MG tablet Take 10 mg by mouth daily.      . Omega-3 Fatty Acids (FISH OIL) 1000 MG CAPS Take 1,000 mg by mouth daily.      Marland Kitchen oxyCODONE (OXY IR/ROXICODONE) 5 MG immediate release tablet Take 1-2 tablets (5-10 mg total) by mouth every 4 (four) hours as needed (pain).  50 tablet  0  . PRESCRIPTION MEDICATION Apply 1 application topically 2 (two) times daily. 0.2% Nitroglycerin ointment. Apply to perianal area/hemorrhoids.   Used for hemorrhoids.       No current facility-administered medications for this visit.    Allergies as of 06/22/2013  . (No Known Allergies)    Vitals: BP 135/88  Kline 83  Resp 18  Ht 6\' 1"  (1.854 m)  Wt 303 lb (137.44 kg)  BMI 39.98 kg/m2 Last Weight:  Wt Readings from Last 1 Encounters:  06/22/13 303 lb (137.44 kg)   Last Height:   Ht Readings from Last 1 Encounters:  06/22/13 6\' 1"  (1.854 m)    Physical exam:  General: The patient is awake, alert and appears not in acute distress. The patient is well groomed. Head: Normocephalic, atraumatic. Neck is supple. Mallampati 2 , neck circumference: 19, the patient has a reddened , irritated airway and swollen uvula. Nasal congestion, no TMJ.  Cardiovascular:  Regular rate and rhythm , without  murmurs or carotid bruit, and without distended neck veins. Respiratory: Lungs are clear to auscultation. Skin:  Without evidence of edema, or rash Trunk: BMI is  elevated .  Neurologic exam : The patient is awake and alert, oriented to place and time.  Memory  subjective  described as intact. There is a normal attention span & concentration ability. Speech is fluent with dysphonia. Mood and affect are appropriate.  Cranial nerves: Pupils are equal and briskly reactive to light. Funduscopic exam without  evidence of pallor or edema. Extraocular movements  in vertical and horizontal planes intact and without nystagmus. Visual fields by finger perimetry are intact. Hearing to finger rub intact.  Facial sensation intact to fine touch. Facial motor strength is symmetric and tongue and uvula move midline.  Motor exam:  Normal tone and  muscle bulk and symmetric  strength in all extremities.  Sensory:  Fine touch, pinprick and vibration were tested in all extremities. Proprioception  normal.  Coordination: Rapid alternating movements in the fingers/hands is normal. Finger-to-nose maneuver tested and normal without evidence of ataxia, dysmetria or tremor.  Gait and station: Patient walks without assistive device. Deep tendon reflexes: in the  upper and lower extremities are symmetric and intact.   Assessment:  After physical and neurologic examination, review of laboratory studies, imaging, neurophysiology testing and pre-existing records, assessment is   1)  OSA on CPAP , needing supplies.   2)patient with sinusitis, rhinitis and bruxism. Risk factor for snoring, UARS.  3) obesity  - This is main risk factor for OSA .   Plan:  Treatment plan and additional workup :  Weight loss  Strategies discussed.  OSA completely controlled on current settings, needs supplies for CPAP.

## 2013-06-22 NOTE — Patient Instructions (Signed)

## 2013-06-23 ENCOUNTER — Telehealth (INDEPENDENT_AMBULATORY_CARE_PROVIDER_SITE_OTHER): Payer: Self-pay | Admitting: *Deleted

## 2013-06-23 NOTE — Telephone Encounter (Signed)
Pt was initially scheduled for a postop appt on 1/21 @ 12:15pm and he was requesting a sooner appt.  Per Dr. Grandville Silos he can see the pt on 1/12 @ 3:30pm before his meeting.  Pt is agreeable with the new appt.

## 2013-06-25 ENCOUNTER — Encounter: Payer: Self-pay | Admitting: Neurology

## 2013-06-28 ENCOUNTER — Ambulatory Visit (INDEPENDENT_AMBULATORY_CARE_PROVIDER_SITE_OTHER): Payer: BC Managed Care – PPO

## 2013-06-28 VITALS — Wt 300.5 lb

## 2013-06-28 DIAGNOSIS — Z5189 Encounter for other specified aftercare: Secondary | ICD-10-CM

## 2013-06-28 NOTE — Progress Notes (Signed)
Patient comes in for follow up visit after ventral hernia repair.  He is not feeling as well as he thought he would and isn't ready to go back to work.  He has been up walking around more and feels a pulling sensation on his abdomen.  He demonstrated to me where Dr Grandville Silos showed him he put the mesh.  He is also feeling a knot under the rlq incision that he was afraid he pulled something.  I examined his abdomen and incisions.  The incisions appeared healed.  I palpated the rlq incision and I feel the small knot which I told the pt it is the sutures under the skin the will absorb and soften up.  He has a small amount of swelling midline right where the hernia was.  I talked to him about a seroma which I think this is.  It is not bothering him.  He states he only wears his binder at night to sleep.  It hurts his back to wear it.  When he stands up he feels a pulling down sensation on his abdomen which hurts him but most of his pain is in his back.  I advised he should wear the binder while up and walking around to support his abdomen and so it won't pull.  I encouraged using a pillow as a splint when he coughs, stands up, etc.  He is doing that.  He is eating well and moving his bowels now that he is off pain medicine.  I explained it will take a while for his body to adjust and completely heal with the mesh.  He thought he would heal as quickly as he did after his gallbladder surgery.

## 2013-06-28 NOTE — Patient Instructions (Signed)
You may shower but not get in the bath tub, hot tub or pool.  You must be about 4 weeks out for that.  See Dr Grandville Silos next week to make sure you are progressing as should.  Since this was a large hernia it can take you a little longer to bounce back.  We want you to be eating and drinking ok and moving your bowels.  Call with any fever or signs of infection.  Wait to return to work until after Dr Grandville Silos sees you.

## 2013-07-07 ENCOUNTER — Encounter (INDEPENDENT_AMBULATORY_CARE_PROVIDER_SITE_OTHER): Payer: Self-pay | Admitting: General Surgery

## 2013-07-07 ENCOUNTER — Ambulatory Visit (INDEPENDENT_AMBULATORY_CARE_PROVIDER_SITE_OTHER): Payer: BC Managed Care – PPO | Admitting: General Surgery

## 2013-07-07 ENCOUNTER — Encounter (INDEPENDENT_AMBULATORY_CARE_PROVIDER_SITE_OTHER): Payer: BC Managed Care – PPO | Admitting: General Surgery

## 2013-07-07 ENCOUNTER — Encounter (INDEPENDENT_AMBULATORY_CARE_PROVIDER_SITE_OTHER): Payer: Self-pay

## 2013-07-07 VITALS — BP 140/98 | HR 68 | Temp 98.2°F | Resp 14 | Ht 73.0 in | Wt 302.8 lb

## 2013-07-07 DIAGNOSIS — Z9889 Other specified postprocedural states: Principal | ICD-10-CM

## 2013-07-07 DIAGNOSIS — Z8719 Personal history of other diseases of the digestive system: Secondary | ICD-10-CM

## 2013-07-07 NOTE — Progress Notes (Signed)
Subjective:     Patient ID: Jerome Kline, male   DOB: 11-24-1965, 48 y.o.   MRN: [id number]  HPI Patient is status post laparoscopic ventral incisional hernia repair with mesh. He is doing very well. He is no longer taking pain medication.  Review of Systems     Objective:   Physical Exam Abdomen is soft and nontender. All incisions are well-healed without signs of infection. He does have a small seroma at the hernia defect site. No evidence of infection or other issues.    Assessment:     Doing very well status post laparoscopic Ventral incisional hernia repair with mesh    Plan:       Avoid heavy lifting for a full 6 weeks after surgery. He may return to work this coming Monday on January 26. Followup when necessary

## 2013-08-31 ENCOUNTER — Telehealth: Payer: Self-pay | Admitting: Internal Medicine

## 2013-08-31 NOTE — Telephone Encounter (Signed)
I have advised Jerome Kline to have his records sent to [place] for review. [facility] is down so I was not able to see whom he saw back in 1999. Will check once records are received.

## 2013-09-02 ENCOUNTER — Telehealth: Payer: Self-pay

## 2013-09-02 NOTE — Telephone Encounter (Signed)
Rec'd from [facility]  forward 13 pages to Dr. Ruben Im Provider

## 2013-09-03 ENCOUNTER — Telehealth: Payer: Self-pay | Admitting: Internal Medicine

## 2013-09-14 ENCOUNTER — Encounter: Payer: Self-pay | Admitting: Internal Medicine

## 2013-11-04 ENCOUNTER — Ambulatory Visit: Payer: BC Managed Care – PPO | Admitting: Internal Medicine

## 2013-12-10 NOTE — Telephone Encounter (Signed)
appt made and pt cancelled appt

## 2014-01-25 ENCOUNTER — Other Ambulatory Visit: Payer: Self-pay | Admitting: *Deleted

## 2014-06-22 ENCOUNTER — Ambulatory Visit: Payer: BC Managed Care – PPO | Admitting: Neurology

## 2014-06-30 ENCOUNTER — Encounter: Payer: Self-pay | Admitting: Neurology

## 2014-06-30 ENCOUNTER — Ambulatory Visit (INDEPENDENT_AMBULATORY_CARE_PROVIDER_SITE_OTHER): Payer: BLUE CROSS/BLUE SHIELD | Admitting: Neurology

## 2014-06-30 VITALS — BP 138/87 | HR 72 | Resp 16 | Ht 72.75 in | Wt 330.5 lb

## 2014-06-30 DIAGNOSIS — Z9989 Dependence on other enabling machines and devices: Secondary | ICD-10-CM

## 2014-06-30 DIAGNOSIS — J3081 Allergic rhinitis due to animal (cat) (dog) hair and dander: Secondary | ICD-10-CM

## 2014-06-30 DIAGNOSIS — J309 Allergic rhinitis, unspecified: Secondary | ICD-10-CM | POA: Insufficient documentation

## 2014-06-30 DIAGNOSIS — G4733 Obstructive sleep apnea (adult) (pediatric): Secondary | ICD-10-CM

## 2014-06-30 MED ORDER — FLUTICASONE PROPIONATE 50 MCG/ACT NA SUSP: NASAL | Status: DC

## 2014-06-30 MED ORDER — FEXOFENADINE HCL 60 MG PO TABS: 60.0000 mg | ORAL_TABLET | Freq: Two times a day (BID) | ORAL | Status: DC

## 2014-06-30 NOTE — Progress Notes (Signed)
[facility]  Provider:  Larey Seat, M D  Referring Provider: Marton Redwood, MD Primary Care Physician:  Marton Redwood, MD  Chief Complaint  Patient presents with  . RV sleep cpap    Rm 10, alone    HPI:  Jerome Kline is a 49 y.o. male  Is seen here as a referral   from Dr. Brigitte Pulse for a sleep medicine consultation.    [name] was originally referred in March 2013 for a polysomnography study, performed at [facility] sleep .  At the time the patient was 49 years old, a right-handed, Caucasian, married male withbesity, hypertriglyceridemia, hypertension, borderline diabetes, fatigue and hypogonadism. His sleep complaints that included insomnia fatigue and loud witnessed snoring. The patient had endorsed  the Epworth Sleepiness Scale only at 1 point and had nor points and list on the backs inventory. His BMI at that time was 43.0. He had allergic rhinitis and took Claritin when necessary. The study documented an AHI of 61.2 and an RDI of 75.5 the REM AHI was 76.4 in the supine AHI was 81.4. The desaturation index of 3% or more for oxygen per hour was 72.7.  The 02 nadir was 76% with a total time of 72 minutes with low oxygen levels.  The patient was titrated to 10 cm CPAP with a resulting AHI of 0 the oxygen nadir rules to 85% at the time and desaturation was now only 10.9 minutes. The average heart rate was in normal sinus rhythm. The patient used a fullface mask during the sleep study which had been his choice given that he had  numerous sinus and ENT surgeries. Jerome Kline reports 2015 that he has used the CPAP machine faithfully for or 20 months now, and  Learnt only now that his DME ([employer]) v can not provide parts without a doctors visit.   An in office download was obtained for data between October 2013 and January 5th 2015 (a very long period of time)- the patient's medium daily usage of CPAP is 7 hours 28 minutes , also he does not necessarily uses machine every day  of the week,  as he travels.  The average daily usage still is 4 hours and 16 minutes and fulfills CMS criteria.  The residual AHI is 0.8 in the machine is set at 10 cm water with an EPR of 2.  The patient is knowledge of and he describes his bedtime routine as follows. Bedtime is anywhere between 10 PM and midnight, he falls asleep promptly, he will have one or 2 bathroom break at night, but falls asleep promptly again. In the morning he normally rises at about 5:30 Am, tries to [place]. Some days of the week he can work whole and rises at 8 AM. The average nocturnal time in sleep is about 7 hours. He does not use caffeine at the beverages in the morning, he will drink iced tea for lunch and before supper. Rarely will he drinks caffeinated sodas. He is a nonsmoker, very rarely drinks alcohol.  The patient has turbinate and adenoid surgery as well as a nasal septal plasty. These were meant to help him prevent sinusitis and also allow him to breathe unhindered through the nasal passage  This has not been entirely successful. He has had extensive fusion anterior access, to the  neck  level C5-6-7-  he has a mild retrognathia.   06-30-14 The squamous here for regular yearly revisit with his CPAP I was able to obtain a  compliance report the tracing of his CPAP machine shows and 97% compliance for days of use over 30 days and all these days the machine was used over 4 hours. His average nightly usage of 7 hours and 52 minutes, centimeter water setting this 2 cm EPR. His residual AHI is 1.0. But his compliance was excellent and the effect on his apnea proven he does have a lot of ear leaks and it shows here that the median air leak is 37.6 L/m so his current mask seems not to fit him very well he also has pressure marks that are visible and he feels that escaping air causes a strain on his eyes. The mask is also pushing into his eyes. I was able to obtain an 90 day download which has similar results 7 hours  and 28 minutes of daily use and 99% compliance for days and at 98% compliance for over 4 hours of use same setting residual AHI 1.1. The  Air leak as seen on the tracing - and begun to be more severe around November 22 , by Thanksgiving it has reached maximum levels.  It is very possible that the mask is brittle and is just not longer providing a seal, but the patient feels strongly that th size is not right.  The patient also has rhinitis and mild tachypnea. He would definitely be beneficial to treat him with a nasal spray prior to putting the CPAP on at night perhaps a daytime nondrowsy making antiallergy medicine could be helpful. The patient considers himself a habitual mouth breather and therefore looks for full face mask also he has facial hair. He sleeps on his side. He uses a mirage large FFM ,     Review of Systems: Out of a complete 14 system review, the patient complains of only the following symptoms, and all other reviewed systems are negative. epworth 4 , FSS 26 .  He has almost no residual apneas. Currently he suffers from a acute rhinitis and probably some sinusitis and bronchitis.  The fullface mask a little more comfortable for him as he is a mouth breather. He also has facial hair. He took his mask to the hospital for his recent hernia repair surgery.     History   Social History  . Marital Status: Married    Spouse Name: Jerome Kline    Number of Children: 1  . Years of Education: college   Occupational History  . [employer]    Social History Main Topics  . Smoking status: Never Smoker   . Smokeless tobacco: Never Used  . Alcohol Use: No  . Drug Use: No  . Sexual Activity: Yes   Other Topics Concern  . Not on file   Social History Narrative    Family History  Problem Relation Age of Onset  . Heart disease Maternal Grandmother   . Heart disease Paternal Grandfather     Past Medical History  Diagnosis Date  . Hernia     ventral  . OSA  (obstructive sleep apnea)     on CPAP  . Dyslipidemia     takes Trilipix daily  . ED (erectile dysfunction)   . Seasonal allergies     takes Claritin daily  . PONV (postoperative nausea and vomiting)   . Asthma     allergy induced asthma but doesn't use inhaler  . Pneumonia     last time about 33yrs ago  . History of bronchitis   . Headache(784.0)  occasionally  . Joint pain     knees/hips/shoulders  . Hemorrhoids   . History of kidney stones     Past Surgical History  Procedure Laterality Date  . Cholecystectomy  1998    Lap Chole  . Nose surgery    . Cervical fusion  12/10    C4-5 and C6-7  . Knee arthroscopy w/ acl reconstruction    . Nose surgery    . Cervical spine surgery    . Shoulder arthroscopy with rotator cuff repair      Lt shoulder  . Myringotomy      x 2  . Adenoidectomy    . Colonoscopy    . Esophagogastroduodenoscopy    . Ventral hernia repair N/A 06/14/2013    Procedure: LAPAROSCOPIC VENTRAL HERNIA;  Surgeon: Zenovia Jarred, MD;  Location: [place];  Service: General;  Laterality: N/A;  . Insertion of mesh N/A 06/14/2013    Procedure: INSERTION OF MESH;  Surgeon: Zenovia Jarred, MD;  Location: [place];  Service: General;  Laterality: N/A;    Current Outpatient Prescriptions  Medication Sig Dispense Refill  . Choline Fenofibrate (TRILIPIX) 135 MG capsule Take 135 mg by mouth daily.    Marland Kitchen loratadine (CLARITIN) 10 MG tablet Take 10 mg by mouth daily.    . mometasone (NASONEX) 50 MCG/ACT nasal spray Place 2 sprays into the nose daily. 17 g 12  . Omega-3 Fatty Acids (FISH OIL) 1000 MG CAPS Take 1,000 mg by mouth daily.     No current facility-administered medications for this visit.    Allergies as of 06/30/2014  . (No Known Allergies)    Vitals: BP 138/87 mmHg  Pulse 72  Resp 16  Ht 6' 0.75" (1.848 m)  Wt 330 lb 8 oz (149.914 kg)  BMI 43.90 kg/m2 Last Weight:  Wt Readings from Last 1 Encounters:  06/30/14 330 lb 8 oz (149.914 kg)    Last Height:   Ht Readings from Last 1 Encounters:  06/30/14 6' 0.75" (1.848 m)    Physical exam:  General: The patient is awake, alert and appears not in acute distress. The patient is well groomed. Head: Normocephalic, atraumatic. Neck is supple. Mallampati 2 , neck circumference: 19,  the patient has a reddened , irritated airway and swollen uvula. Nasal congestion, no TMJ.  Cardiovascular:  Regular rate and rhythm , without  murmurs or carotid bruit, and without distended neck veins. Respiratory: Lungs are clear to auscultation. Skin:  Without evidence of edema, or rash Trunk: BMI is  elevated .  Neurologic exam : The patient is awake and alert, oriented to place and time.   Memory subjective  described as intact. There is a normal attention span & concentration ability.  Speech is fluent with dysphonia. Mood and affect are appropriate.  Cranial nerves: Pupils are equal and briskly reactive to light.  Funduscopic exam deferred,  Extraocular movements  in vertical and horizontal planes intact and without nystagmus. Visual fields by finger perimetry are intact. Hearing to finger rub intact.   Facial sensation intact to fine touch. Facial motor strength is symmetric and tongue and uvula move midline.  Motor exam:  Normal tone and muscle bulk and symmetric strength in all extremities.  Sensory:  Fine touch, pinprick and vibration were normal.  Coordination: Finger-to-nose maneuver tested and normal without evidence of ataxia, dysmetria or tremor.  Gait and station: Patient walks without assistive device. Deep tendon reflexes: in the  upper and lower extremities are symmetric and  intact.   Assessment:  After physical and neurologic examination, review of laboratory studies, imaging, neurophysiology testing and pre-existing records,  15 minute assessment is   1)  OSA on CPAP , needing supplies.  Good apnea resolution . Poor mask fit.  2)patient with sinusitis, rhinitis and  bruxism. Risk factor for snoring, UARS. -  3) obesity  - This is main risk factor for OSA .   Plan:  Treatment plan and additional workup :  1)Weight loss : BMI is obese-  Strategies discussed.  2)OSA completely controlled on current settings, needs supplies for CPAP.  3)He needs a new mask, this one produces pressure marks.  4)rhinitis , treatment with fexofenadine and with nasocort.

## 2015-07-03 ENCOUNTER — Encounter: Payer: Self-pay | Admitting: Adult Health

## 2015-07-03 ENCOUNTER — Ambulatory Visit (INDEPENDENT_AMBULATORY_CARE_PROVIDER_SITE_OTHER): Payer: BLUE CROSS/BLUE SHIELD | Admitting: Adult Health

## 2015-07-03 VITALS — BP 130/85 | HR 69 | Ht 72.0 in | Wt 341.0 lb

## 2015-07-03 DIAGNOSIS — G4733 Obstructive sleep apnea (adult) (pediatric): Secondary | ICD-10-CM

## 2015-07-03 DIAGNOSIS — Z9989 Dependence on other enabling machines and devices: Principal | ICD-10-CM

## 2015-07-03 NOTE — Progress Notes (Addendum)
PATIENT: Jerome Kline DOB: 12/09/1965  REASON FOR VISIT: follow up- OSA on CPAP HISTORY FROM: patient  HISTORY OF PRESENT ILLNESS: Jerome Kline is a 50 year old male with a history of obstructive sleep apnea on CPAP. He returns today for follow-up. His download indicates that he uses machine 29 out of 30 days for compliance of 97%. On average he uses his machine greater than 4 hours 29 out of 30 days for compliance of 97%. His residual AHI is 1.0 on 10 cm of water with EPR 2. The patient does have a significant leak in the 95th percentile at 76.9 L/m. The patient states that at night the mask may push to the right or left depending on which side he is laying. He states that this may be causing a leak. The patient states that he does keep the mask on all night. And he does feel that he is sleeping better with the machine. He denies any new neurological symptoms. He returns today for an evaluation.  HISTORY (DOHMEIER): Jerome Kline is a 50 y.o. male Is seen here as a referral from Dr. Brigitte Kline for a sleep medicine consultation. Jerome Kline was originally referred in March 2013 for a polysomnography study, performed at [facility] sleep . At the time the patient was 50 years old, a right-handed, Caucasian, married male withbesity, hypertriglyceridemia, hypertension, borderline diabetes, fatigue and hypogonadism. His sleep complaints that included insomnia fatigue and loud witnessed snoring. The patient had endorsed the Epworth Sleepiness Scale only at 1 point and had nor points and list on the backs inventory. His BMI at that time was 43.0. He had allergic rhinitis and took Claritin when necessary. The study documented an AHI of 61.2 and an RDI of 75.5 the REM AHI was 76.4 in the supine AHI was 81.4. The desaturation index of 3% or more for oxygen per hour was 72.7. The 02 nadir was 76% with a total time of 72 minutes with low oxygen levels. The patient was titrated to 10 cm CPAP with a resulting AHI  of 0 the oxygen nadir rules to 85% at the time and desaturation was now only 10.9 minutes. The average heart rate was in normal sinus rhythm. The patient used a fullface mask during the sleep study which had been his choice given that he had numerous sinus and ENT surgeries. Jerome Kline reports 2015 that he has used the CPAP machine faithfully for or 20 months now, and Learnt only now that his DME ([employer]) v can not provide parts without a doctors visit.  An in office download was obtained for data between October 2013 and January 5th 2015 (a very long period of time)- the patient's medium daily usage of CPAP is 7 hours 28 minutes , also he does not necessarily uses machine every day of the week, as he travels.  The average daily usage still is 4 hours and 16 minutes and fulfills CMS criteria.  The residual AHI is 0.8 in the machine is set at 10 cm water with an EPR of 2.  The patient is knowledge of and he describes his bedtime routine as follows. Bedtime is anywhere between 10 PM and midnight, he falls asleep promptly, he will have one or 2 bathroom break at night, but falls asleep promptly again. In the morning he normally rises at about 5:30 Am, tries to [place]. Some days of the week he can work whole and rises at 8 AM. The average nocturnal time in sleep is about 7  hours. He does not use caffeine at the beverages in the morning, he will drink iced tea for lunch and before supper. Rarely will he drinks caffeinated sodas. He is a nonsmoker, very rarely drinks alcohol.  The patient has turbinate and adenoid surgery as well as a nasal septal plasty. These were meant to help him prevent sinusitis and also allow him to breathe unhindered through the nasal passage This has not been entirely successful. He has had extensive fusion anterior access, to the neck level C5-6-7- he has a mild retrognathia.   06-30-14 The squamous here for regular yearly revisit with his CPAP I was able to obtain a  compliance report the tracing of his CPAP machine shows and 97% compliance for days of use over 30 days and all these days the machine was used over 4 hours. His average nightly usage of 7 hours and 52 minutes, centimeter water setting this 2 cm EPR. His residual AHI is 1.0. But his compliance was excellent and the effect on his apnea proven he does have a lot of ear leaks and it shows here that the median air leak is 37.6 L/m so his current mask seems not to fit him very well he also has pressure marks that are visible and he feels that escaping air causes a strain on his eyes. The mask is also pushing into his eyes. I was able to obtain an 90 day download which has similar results 7 hours and 28 minutes of daily use and 99% compliance for days and at 98% compliance for over 4 hours of use same setting residual AHI 1.1. The Air leak as seen on the tracing - and begun to be more severe around November 22 , by Thanksgiving it has reached maximum levels. It is very possible that the mask is brittle and is just not longer providing a seal, but the patient feels strongly that th size is not right. The patient also has rhinitis and mild tachypnea. He would definitely be beneficial to treat him with a nasal spray prior to putting the CPAP on at night perhaps a daytime nondrowsy making antiallergy medicine could be helpful. The patient considers himself a habitual mouth breather and therefore looks for full face mask also he has facial hair. He sleeps on his side. He uses a mirage large FFM ,    REVIEW OF SYSTEMS: Out of a complete 14 system review of symptoms, the patient complains only of the following symptoms, and all other reviewed systems are negative.  ALLERGIES: No Known Allergies  HOME MEDICATIONS: Outpatient Prescriptions Prior to Visit  Medication Sig Dispense Refill  . Choline Fenofibrate (TRILIPIX) 135 MG capsule Take 135 mg by mouth daily.    Marland Kitchen loratadine (CLARITIN) 10 MG tablet Take 10 mg by  mouth daily.    . mometasone (NASONEX) 50 MCG/ACT nasal spray Place 2 sprays into the nose daily. 17 g 12  . Omega-3 Fatty Acids (FISH OIL) 1000 MG CAPS Take 1,000 mg by mouth daily.    . fexofenadine (ALLEGRA) 60 MG tablet Take 1 tablet (60 mg total) by mouth 2 (two) times daily. 60 tablet 5  . fluticasone (FLONASE) 50 MCG/ACT nasal spray Use in each s nostril one spray prior to CPAP use. 50 g 2   No facility-administered medications prior to visit.    PAST MEDICAL HISTORY: Past Medical History  Diagnosis Date  . Hernia     ventral  . OSA (obstructive sleep apnea)     on  CPAP  . Dyslipidemia     takes Trilipix daily  . ED (erectile dysfunction)   . Seasonal allergies     takes Claritin daily  . PONV (postoperative nausea and vomiting)   . Asthma     allergy induced asthma but doesn't use inhaler  . Pneumonia     last time about 37yrs ago  . History of bronchitis   . Headache(784.0)     occasionally  . Joint pain     knees/hips/shoulders  . Hemorrhoids   . History of kidney stones     PAST SURGICAL HISTORY: Past Surgical History  Procedure Laterality Date  . Cholecystectomy  1998    Lap Chole  . Nose surgery    . Cervical fusion  12/10    C4-5 and C6-7  . Knee arthroscopy w/ acl reconstruction    . Nose surgery    . Cervical spine surgery    . Shoulder arthroscopy with rotator cuff repair      Lt shoulder  . Myringotomy      x 2  . Adenoidectomy    . Colonoscopy    . Esophagogastroduodenoscopy    . Ventral hernia repair N/A 06/14/2013    Procedure: LAPAROSCOPIC VENTRAL HERNIA;  Surgeon: Zenovia Jarred, MD;  Location: [place];  Service: General;  Laterality: N/A;  . Insertion of mesh N/A 06/14/2013    Procedure: INSERTION OF MESH;  Surgeon: Zenovia Jarred, MD;  Location: [place];  Service: General;  Laterality: N/A;    FAMILY HISTORY: Family History  Problem Relation Age of Onset  . Heart disease Maternal Grandmother   . Heart disease Paternal Grandfather      SOCIAL HISTORY: Social History   Social History  . Marital Status: Married    Spouse Name: Engineer, civil (consulting)  . Number of Children: 1  . Years of Education: college   Occupational History  . [employer]    Social History Main Topics  . Smoking status: Never Smoker   . Smokeless tobacco: Never Used  . Alcohol Use: No  . Drug Use: No  . Sexual Activity: Yes   Other Topics Concern  . Not on file   Social History Narrative      PHYSICAL EXAM  Filed Vitals:   07/03/15 0816  BP: 130/85  Kline: 69  Height: 6' (1.829 m)  Weight: 341 lb (154.677 kg)   Body mass index is 46.24 kg/(m^2).  Generalized: Well developed, in no acute distress   Neurological examination  Mentation: Alert oriented to time, place, history taking. Follows all commands speech and language fluent Cranial nerve II-XII: Pupils were equal round reactive to light. Extraocular movements were full, visual field were full on confrontational test. Facial sensation and strength were normal. Uvula tongue midline. Head turning and shoulder shrug  were normal and symmetric. Motor: The motor testing reveals 5 over 5 strength of all 4 extremities. Good symmetric motor tone is noted throughout.  Sensory: Sensory testing is intact to soft touch on all 4 extremities. No evidence of extinction is noted.  Coordination: Cerebellar testing reveals good finger-nose-finger and heel-to-shin bilaterally.  Gait and station: Gait is normal. Tandem gait is normal. Romberg is negative. No drift is seen.  Reflexes: Deep tendon reflexes are symmetric and normal bilaterally.   DIAGNOSTIC DATA (LABS, IMAGING, TESTING) - I reviewed patient records, labs, notes, testing and imaging myself where available.       ASSESSMENT AND PLAN 50 y.o. year old male  has  a past medical history of Hernia; OSA (obstructive sleep apnea); Dyslipidemia; ED (erectile dysfunction); Seasonal allergies; PONV (postoperative nausea and vomiting);  Asthma; Pneumonia; History of bronchitis; Headache(784.0); Joint pain; Hemorrhoids; and History of kidney stones. here with:  1. Obstructive sleep apnea on CPAP  The patient's compliance is excellent however he does have a significant leak. I will have the patient visit our sleep lab today to have his mask refitted and adjusted. Patient is amenable to this plan. He will return in 3 months for another download. Patient advised that if his symptoms worsen or he develops any new symptoms he should let us know. He will follow-up in 6 months or sooner if needed.     Ward Givens, MSN, NP-C 07/03/2015, 8:26 AM [facility] [address] [place], [place] [address] [phone]   I reviewed the above note and documentation by the Nurse Practitioner and agree with the history, physical exam, assessment and plan as outlined above. I was immediately available for face-to-face consultation. Star Age, MD, PhD [facility])

## 2015-07-03 NOTE — Patient Instructions (Signed)
Continue using CPAP nightly  Visit sleep lab today to have mask refitted.  If your symptoms worsen or you develop new symptoms please let us know.

## 2015-10-09 ENCOUNTER — Ambulatory Visit: Payer: BLUE CROSS/BLUE SHIELD | Admitting: Adult Health

## 2015-10-09 ENCOUNTER — Telehealth: Payer: Self-pay | Admitting: *Deleted

## 2015-10-09 NOTE — Telephone Encounter (Signed)
Pt no showed appt today

## 2015-10-10 ENCOUNTER — Encounter: Payer: Self-pay | Admitting: Adult Health

## 2016-07-15 ENCOUNTER — Telehealth: Payer: Self-pay | Admitting: *Deleted

## 2016-07-15 ENCOUNTER — Ambulatory Visit: Payer: BLUE CROSS/BLUE SHIELD | Admitting: Adult Health

## 2016-07-15 NOTE — Telephone Encounter (Signed)
Patient arrived late to his appointment.  Unable to be seen.

## 2016-11-25 ENCOUNTER — Ambulatory Visit: Payer: BLUE CROSS/BLUE SHIELD | Admitting: Podiatry

## 2017-05-13 DIAGNOSIS — J209 Acute bronchitis, unspecified: Secondary | ICD-10-CM | POA: Diagnosis not present

## 2017-05-13 DIAGNOSIS — R0601 Orthopnea: Secondary | ICD-10-CM | POA: Diagnosis not present

## 2017-05-13 DIAGNOSIS — R05 Cough: Secondary | ICD-10-CM | POA: Diagnosis not present

## 2017-05-27 DIAGNOSIS — G4733 Obstructive sleep apnea (adult) (pediatric): Secondary | ICD-10-CM | POA: Diagnosis not present

## 2017-06-28 DIAGNOSIS — J208 Acute bronchitis due to other specified organisms: Secondary | ICD-10-CM | POA: Diagnosis not present

## 2017-06-28 DIAGNOSIS — R062 Wheezing: Secondary | ICD-10-CM | POA: Diagnosis not present

## 2017-07-04 DIAGNOSIS — R05 Cough: Secondary | ICD-10-CM | POA: Diagnosis not present

## 2017-07-04 DIAGNOSIS — J988 Other specified respiratory disorders: Secondary | ICD-10-CM | POA: Diagnosis not present

## 2017-07-04 DIAGNOSIS — J9801 Acute bronchospasm: Secondary | ICD-10-CM | POA: Diagnosis not present

## 2017-07-14 DIAGNOSIS — G4733 Obstructive sleep apnea (adult) (pediatric): Secondary | ICD-10-CM | POA: Diagnosis not present

## 2017-08-01 DIAGNOSIS — J209 Acute bronchitis, unspecified: Secondary | ICD-10-CM | POA: Diagnosis not present

## 2017-08-01 DIAGNOSIS — R062 Wheezing: Secondary | ICD-10-CM | POA: Diagnosis not present

## 2017-08-01 DIAGNOSIS — R05 Cough: Secondary | ICD-10-CM | POA: Diagnosis not present

## 2017-08-01 DIAGNOSIS — J309 Allergic rhinitis, unspecified: Secondary | ICD-10-CM | POA: Diagnosis not present

## 2017-08-11 ENCOUNTER — Institutional Professional Consult (permissible substitution): Payer: Self-pay | Admitting: Internal Medicine

## 2017-08-14 DIAGNOSIS — G4733 Obstructive sleep apnea (adult) (pediatric): Secondary | ICD-10-CM | POA: Diagnosis not present

## 2017-08-28 DIAGNOSIS — G4733 Obstructive sleep apnea (adult) (pediatric): Secondary | ICD-10-CM | POA: Diagnosis not present

## 2017-09-11 DIAGNOSIS — G4733 Obstructive sleep apnea (adult) (pediatric): Secondary | ICD-10-CM | POA: Diagnosis not present

## 2017-10-07 DIAGNOSIS — F17210 Nicotine dependence, cigarettes, uncomplicated: Secondary | ICD-10-CM | POA: Diagnosis not present

## 2017-10-12 DIAGNOSIS — G4733 Obstructive sleep apnea (adult) (pediatric): Secondary | ICD-10-CM | POA: Diagnosis not present

## 2017-11-11 DIAGNOSIS — G4733 Obstructive sleep apnea (adult) (pediatric): Secondary | ICD-10-CM | POA: Diagnosis not present

## 2017-12-12 DIAGNOSIS — G4733 Obstructive sleep apnea (adult) (pediatric): Secondary | ICD-10-CM | POA: Diagnosis not present

## 2018-01-11 DIAGNOSIS — G4733 Obstructive sleep apnea (adult) (pediatric): Secondary | ICD-10-CM | POA: Diagnosis not present

## 2018-02-11 DIAGNOSIS — G4733 Obstructive sleep apnea (adult) (pediatric): Secondary | ICD-10-CM | POA: Diagnosis not present

## 2018-03-09 DIAGNOSIS — G4733 Obstructive sleep apnea (adult) (pediatric): Secondary | ICD-10-CM | POA: Diagnosis not present

## 2018-03-14 DIAGNOSIS — G4733 Obstructive sleep apnea (adult) (pediatric): Secondary | ICD-10-CM | POA: Diagnosis not present

## 2018-04-13 DIAGNOSIS — G4733 Obstructive sleep apnea (adult) (pediatric): Secondary | ICD-10-CM | POA: Diagnosis not present

## 2018-05-14 DIAGNOSIS — G4733 Obstructive sleep apnea (adult) (pediatric): Secondary | ICD-10-CM | POA: Diagnosis not present

## 2018-09-07 DIAGNOSIS — G4733 Obstructive sleep apnea (adult) (pediatric): Secondary | ICD-10-CM | POA: Diagnosis not present

## 2018-09-08 DIAGNOSIS — G4733 Obstructive sleep apnea (adult) (pediatric): Secondary | ICD-10-CM | POA: Diagnosis not present

## 2018-10-28 DIAGNOSIS — L723 Sebaceous cyst: Secondary | ICD-10-CM | POA: Diagnosis not present

## 2018-12-03 DIAGNOSIS — G4733 Obstructive sleep apnea (adult) (pediatric): Secondary | ICD-10-CM | POA: Diagnosis not present

## 2019-03-10 DIAGNOSIS — B349 Viral infection, unspecified: Secondary | ICD-10-CM | POA: Diagnosis not present

## 2019-03-10 DIAGNOSIS — Z20818 Contact with and (suspected) exposure to other bacterial communicable diseases: Secondary | ICD-10-CM | POA: Diagnosis not present

## 2019-03-10 DIAGNOSIS — J029 Acute pharyngitis, unspecified: Secondary | ICD-10-CM | POA: Diagnosis not present

## 2019-03-11 DIAGNOSIS — G4733 Obstructive sleep apnea (adult) (pediatric): Secondary | ICD-10-CM | POA: Diagnosis not present

## 2019-03-17 DIAGNOSIS — R05 Cough: Secondary | ICD-10-CM | POA: Diagnosis not present

## 2019-03-17 DIAGNOSIS — R030 Elevated blood-pressure reading, without diagnosis of hypertension: Secondary | ICD-10-CM | POA: Diagnosis not present

## 2019-03-17 DIAGNOSIS — J069 Acute upper respiratory infection, unspecified: Secondary | ICD-10-CM | POA: Diagnosis not present

## 2019-03-17 DIAGNOSIS — B349 Viral infection, unspecified: Secondary | ICD-10-CM | POA: Diagnosis not present

## 2019-03-24 DIAGNOSIS — Z20828 Contact with and (suspected) exposure to other viral communicable diseases: Secondary | ICD-10-CM | POA: Diagnosis not present

## 2019-03-24 DIAGNOSIS — J189 Pneumonia, unspecified organism: Secondary | ICD-10-CM | POA: Diagnosis not present

## 2019-03-24 DIAGNOSIS — Z20818 Contact with and (suspected) exposure to other bacterial communicable diseases: Secondary | ICD-10-CM | POA: Diagnosis not present

## 2019-03-24 DIAGNOSIS — R0600 Dyspnea, unspecified: Secondary | ICD-10-CM | POA: Diagnosis not present

## 2019-07-30 ENCOUNTER — Other Ambulatory Visit: Payer: Self-pay | Admitting: Podiatry

## 2019-07-30 ENCOUNTER — Encounter: Payer: Self-pay | Admitting: Podiatry

## 2019-07-30 ENCOUNTER — Other Ambulatory Visit: Payer: Self-pay

## 2019-07-30 ENCOUNTER — Ambulatory Visit (INDEPENDENT_AMBULATORY_CARE_PROVIDER_SITE_OTHER): Payer: BC Managed Care – PPO

## 2019-07-30 ENCOUNTER — Ambulatory Visit: Payer: BC Managed Care – PPO | Admitting: Podiatry

## 2019-07-30 VITALS — BP 140/87 | HR 67 | Temp 96.9°F

## 2019-07-30 DIAGNOSIS — M205X2 Other deformities of toe(s) (acquired), left foot: Secondary | ICD-10-CM

## 2019-07-30 DIAGNOSIS — M2011 Hallux valgus (acquired), right foot: Secondary | ICD-10-CM | POA: Diagnosis not present

## 2019-07-30 DIAGNOSIS — M2012 Hallux valgus (acquired), left foot: Secondary | ICD-10-CM | POA: Diagnosis not present

## 2019-07-30 DIAGNOSIS — M21619 Bunion of unspecified foot: Secondary | ICD-10-CM | POA: Diagnosis not present

## 2019-07-30 NOTE — Patient Instructions (Addendum)
Hallux Rigidus  Hallux rigidus is a type of joint pain or joint disease (arthritis) that affects your big toe (hallux). This condition involves the joint that connects the base of your big toe to the main part of your foot (metatarsophalangeal joint or MTP joint). This condition can cause your big toe to become stiff, painful, and difficult to move. Symptoms may get worse with movement or in cold or damp weather. The condition gets worse over time. What are the causes? This condition may be caused by having a foot that does not function the way that it should or that has an abnormal shape (structural deformity). These foot problems can run in families and may be passed down from parents to children (are hereditary). This condition can also be caused by:  Injury.  Overuse.  Certain inflammatory diseases, including gout and rheumatoid arthritis. What increases the risk? You are more likely to develop this condition if you have:  A foot bone (metatarsal) that is longer or higher than normal.  A family history of hallux rigidus.  Previously injured your big toe.  Feet that do not have a curve (arch) on the inner side of the foot. This may be called flat feet or fallen arches.  Ankles that turn in when you walk (pronation).  Rheumatoid arthritis or gout.  A job that requires you to stoop down often at work. What are the signs or symptoms? Symptoms of this condition include:  Big toe pain.  Stiffness and difficulty moving the big toe.  Swelling of the toe and surrounding area.  Bone spurs. These are bony growths that can form on the joint of the big toe.  A limp. How is this diagnosed? This condition is diagnosed based on your medical history and a physical exam. You may also have X-rays. How is this treated? This condition is treated by:  Wearing roomy, comfortable shoes that have a large toe box.  Putting orthotic devices in your shoes.  Taking pain medicines.  Having  physical therapy.  Icing the injured area.  Alternating between putting your foot in cold water and then in warm water. If your condition is severe, treatment may include:  Corticosteroid injections to relieve pain.  Surgery to remove bone spurs, fuse damaged bones together, or replace the entire joint. Follow these instructions at home: Managing pain, stiffness, and swelling   Put your feet in cold water for 30 seconds, and then in warm water for 30 seconds. Alternate between the cold and warm water for 5 minutes. Do this several times a day or as told by your health care provider.  If directed, put ice on the injured area. ? Put ice in a plastic bag. ? Place a towel between your skin and the bag. ? Leave the ice on for 20 minutes, 2-3 times a day. General instructions  Take over-the-counter and prescription medicines only as told by your health care provider.  Do not wear high heels or other restrictive footwear. Wear comfortable, supportive shoes that have a large toe box.  Wear shoe inserts (orthotics) as told by your health care provider, if this applies.  Do foot exercises as instructed by your health care provider or a physical therapist.  Keep all follow-up visits as told by your health care provider. This is important. Contact a health care provider if:  You notice bone spurs or growths on or around your big toe.  Your pain does not get better or it gets worse.  You have  pain while resting.  You have pain in other parts of your body, such as your back, hip, or knee.  You start to limp. Summary  Hallux rigidus is a condition that makes your big toe become stiff, painful, and difficult to move.  It can be caused by injury, overuse, or inflammatory diseases.  This condition may be treated with ice, medicines, physical therapy, and surgery.  Do not wear high heels or other restrictive footwear. Wear comfortable, supportive shoes that have a large toe box. This  information is not intended to replace advice given to you by your health care provider. Make sure you discuss any questions you have with your health care provider. Document Revised: 03/13/2018 Document Reviewed: 03/16/2018 Elsevier Patient Education  2020 [place], you have decided to take an important step towards improving your quality of life.  You can be assured that the doctors and staff at [facility] will be with you every step of the way.  Here are some important things you should know:  1. Plan to be at the surgery center/hospital at least 1 (one) hour prior to your scheduled time, unless otherwise directed by the surgical center/hospital staff.  You must have a responsible adult accompany you, remain during the surgery and drive you home.  Make sure you have directions to the surgical center/hospital to ensure you arrive on time. 2. If you are having surgery at [facility] or [facility], you will need a copy of your medical history and physical form from your family physician within one month prior to the date of surgery. We will give you a form for your primary physician to complete.  3. We make every effort to accommodate the date you request for surgery.  However, there are times where surgery dates or times have to be moved.  We will contact you as soon as possible if a change in schedule is required.   4. No aspirin/ibuprofen for one week before surgery.  If you are on aspirin, any non-steroidal anti-inflammatory medications (Mobic, Aleve, Ibuprofen) should not be taken seven (7) days prior to your surgery.  You make take Tylenol for pain prior to surgery.  5. Medications - If you are taking daily heart and blood pressure medications, seizure, reflux, allergy, asthma, anxiety, pain or diabetes medications, make sure you notify the surgery center/hospital before the day of surgery so they can tell you which medications  you should take or avoid the day of surgery. 6. No food or drink after midnight the night before surgery unless directed otherwise by surgical center/hospital staff. 7. No alcoholic beverages [id number] prior to surgery.  No smoking 24-hours prior or 24-hours after surgery. 8. Wear loose pants or shorts. They should be loose enough to fit over bandages, boots, and casts. 9. Don't wear slip-on shoes. Sneakers are preferred. 10. Bring your boot with you to the surgery center/hospital.  Also bring crutches or a walker if your physician has prescribed it for you.  If you do not have this equipment, it will be provided for you after surgery. 11. If you have not been contacted by the surgery center/hospital by the day before your surgery, call to confirm the date and time of your surgery. 12. Leave-time from work may vary depending on the type of surgery you have.  Appropriate arrangements should be made prior to surgery with your employer. 13. Prescriptions will be provided immediately following surgery by your doctor.  Fill  these as soon as possible after surgery and take the medication as directed. Pain medications will not be refilled on weekends and must be approved by the doctor. 14. Remove nail polish on the operative foot and avoid getting pedicures prior to surgery. 15. Wash the night before surgery.  The night before surgery wash the foot and leg well with water and the antibacterial soap provided. Be sure to pay special attention to beneath the toenails and in between the toes.  Wash for at least three (3) minutes. Rinse thoroughly with water and dry well with a towel.  Perform this wash unless told not to do so by your physician.  Enclosed: 1 Ice pack (please put in freezer the night before surgery)   1 Hibiclens skin cleaner   Pre-op instructions  If you have any questions regarding the instructions, please do not hesitate to call our office.  [place]: [address] [place], [place]  [address] -- [place]: [address] [place], [place] [address] -- [phone]  [place]: [address] [place], [place] [address] -- [phone]   Website: [email or web address]

## 2019-07-30 NOTE — Progress Notes (Signed)
Subjective:   Patient ID: Jerome Kline, male   DOB: 54 y.o.   MRN: [id number]   HPI Patient presents stating he has had arthritis of his big toe joint on his left foot and its been sore and been going on for a long time it is gotten worse over the last 6 months.  He was diagnosed with bone spurs and knows he is needed surgery but was not able to do previous and wants to get this corrected.  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       Objective:  Physical Exam Vitals and nursing note reviewed.  Constitutional:      Appearance: He is well-developed.  Pulmonary:     Effort: Pulmonary effort is normal.  Musculoskeletal:        General: Normal range of motion.  Skin:    General: Skin is warm.  Neurological:     Mental Status: He is alert.     Neurovascular status intact muscle strength found to be adequate range of motion within normal limits.  Patient is noted to have a painful big toe joint left with inflammation fluid around the joint and reduced range of motion but no current crepitus of the joint.  The right shows mild deformity but nowhere near to the same degree with dorsal spur formation noted left.  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  I did explain biplanar osteotomy removal of spurs and the fact that long-term may require fusion implantation procedure.  Patient wants surgery and wait till the end of March and will reappoint in approximately 4 weeks to go over the procedure that will be necessary  X-rays indicate bone spur formation head of first metatarsal left with some flattening of the joint and  narrowing of the joint surface

## 2019-08-16 ENCOUNTER — Telehealth: Payer: Self-pay | Admitting: Podiatry

## 2019-08-16 NOTE — Telephone Encounter (Addendum)
DOS: 09/14/2019  SURGICAL PROCEDURE: [facility] [place] [facility] [id number]).  BCBS Policy Effective : 123XX123  -  06/16/9998  Member Liability Summary  In-Network   Max Per Benefit Period Year-to-Date Remaining     CoInsurance         Deductible  $400.00 $400.00     Out-Of-Pocket 3 $2,500.00 $2,460.00 3 Out-of-Pocket includes copay, deductible, and coinsurance.  H[facility]  In-Network Copay Coinsurance Authorization Required Not Applicable 0000000  Yes Utilization Management Organization: UTILIZATION MANAGEMENT  Telephone:  [phone]  Per Exie Parody D no prior authorization is required. Call ref# [id number].

## 2019-08-27 ENCOUNTER — Ambulatory Visit: Payer: Self-pay | Attending: Internal Medicine

## 2019-08-27 DIAGNOSIS — Z23 Encounter for immunization: Secondary | ICD-10-CM

## 2019-08-27 NOTE — Progress Notes (Signed)
   Covid-19 Vaccination Clinic  Name:  LUDWIG SHUEY    MRN: [id number] DOB: [birth year]  08/27/2019  [name] was observed post Covid-19 immunization for 15 minutes without incident. He was provided with Vaccine Information Sheet and instruction to access the V-Safe system.   [name] was instructed to call 911 with any severe reactions post vaccine: Marland Kitchen Difficulty breathing  . Swelling of face and throat  . A fast heartbeat  . A bad rash all over body  . Dizziness and weakness   Immunizations Administered    Name Date Dose VIS Date Route   Pfizer COVID-19 Vaccine 08/27/2019  8:15 AM 0.3 mL 05/28/2019 Intramuscular   Manufacturer: [place]   Lot: [id number]   [place]: [id number]

## 2019-09-10 ENCOUNTER — Other Ambulatory Visit: Payer: Self-pay

## 2019-09-10 ENCOUNTER — Encounter: Payer: Self-pay | Admitting: Podiatry

## 2019-09-10 ENCOUNTER — Ambulatory Visit: Payer: BC Managed Care – PPO | Admitting: Podiatry

## 2019-09-10 VITALS — Temp 96.2°F

## 2019-09-10 DIAGNOSIS — M205X2 Other deformities of toe(s) (acquired), left foot: Secondary | ICD-10-CM

## 2019-09-10 NOTE — Patient Instructions (Signed)
Pre-Operative Instructions  Congratulations, you have decided to take an important step towards improving your quality of life.  You can be assured that the doctors and staff at [facility] will be with you every step of the way.  Here are some important things you should know:  1. Plan to be at the surgery center/hospital at least 1 (one) hour prior to your scheduled time, unless otherwise directed by the surgical center/hospital staff.  You must have a responsible adult accompany you, remain during the surgery and drive you home.  Make sure you have directions to the surgical center/hospital to ensure you arrive on time. 2. If you are having surgery at [facility] or [facility] hospitals, you will need a copy of your medical history and physical form from your family physician within one month prior to the date of surgery. We will give you a form for your primary physician to complete.  3. We make every effort to accommodate the date you request for surgery.  However, there are times where surgery dates or times have to be moved.  We will contact you as soon as possible if a change in schedule is required.   4. No aspirin/ibuprofen for one week before surgery.  If you are on aspirin, any non-steroidal anti-inflammatory medications (Mobic, Aleve, Ibuprofen) should not be taken seven (7) days prior to your surgery.  You make take Tylenol for pain prior to surgery.  5. Medications - If you are taking daily heart and blood pressure medications, seizure, reflux, allergy, asthma, anxiety, pain or diabetes medications, make sure you notify the surgery center/hospital before the day of surgery so they can tell you which medications you should take or avoid the day of surgery. 6. No food or drink after midnight the night before surgery unless directed otherwise by surgical center/hospital staff. 7. No alcoholic beverages 24-hours prior to surgery.  No smoking 24-hours prior or 24-hours after  surgery. 8. Wear loose pants or shorts. They should be loose enough to fit over bandages, boots, and casts. 9. Don't wear slip-on shoes. Sneakers are preferred. 10. Bring your boot with you to the surgery center/hospital.  Also bring crutches or a walker if your physician has prescribed it for you.  If you do not have this equipment, it will be provided for you after surgery. 11. If you have not been contacted by the surgery center/hospital by the day before your surgery, call to confirm the date and time of your surgery. 12. Leave-time from work may vary depending on the type of surgery you have.  Appropriate arrangements should be made prior to surgery with your employer. 13. Prescriptions will be provided immediately following surgery by your doctor.  Fill these as soon as possible after surgery and take the medication as directed. Pain medications will not be refilled on weekends and must be approved by the doctor. 14. Remove nail polish on the operative foot and avoid getting pedicures prior to surgery. 15. Wash the night before surgery.  The night before surgery wash the foot and leg well with water and the antibacterial soap provided. Be sure to pay special attention to beneath the toenails and in between the toes.  Wash for at least three (3) minutes. Rinse thoroughly with water and dry well with a towel.  Perform this wash unless told not to do so by your physician.  Enclosed: 1 Ice pack (please put in freezer the night before surgery)   1 Hibiclens skin cleaner     Pre-op instructions  If you have any questions regarding the instructions, please do not hesitate to call our office.  [place]: [address], [place], [place] [address] -- [phone]  [place]: [address] , [place] [address] -- [phone]  [place]: [address], [place], [place] [address] -- [phone]   Website: [email or web address] 

## 2019-09-10 NOTE — Progress Notes (Signed)
Subjective:   Patient ID: Warnell Bureau, male   DOB: 54 y.o.   MRN: [id number]   HPI Patient presents stating that the bunion left is becoming increasingly sore and he wants correction and we already discussed that he wants to go over it fully   ROS      Objective:  Physical Exam  Neurovascular status intact with patient found to have large spur dorsal first metatarsal left with diminished range of motion but motion is present with no crepitus of the joint noted     Assessment:  Overall hallux limitus rigidus deformity left with elevated first metatarsal bone spur formation     Plan:  H&P reviewed condition allowed him to go over consent form going over procedure alternative treatments, patient.  He understands that there may be cartilage damage ultimately may require fusion or joint implantation but will get a try to save the joint and I educated him on this and also the fact that I do want him to be active postoperatively.  I reviewed the postoperative course I applied an air fracture walker which I explained to him usage of and I explained total recovery will take 6 months to 1 year and we will do physical therapy and he will require orthotics long-term.  Patient scheduled for outpatient surgery encouraged to call us with questions prior to procedure next week

## 2019-09-13 MED ORDER — ONDANSETRON HCL 4 MG PO TABS: 4.0000 mg | ORAL_TABLET | Freq: Three times a day (TID) | ORAL | 0 refills | Status: DC | PRN

## 2019-09-13 MED ORDER — OXYCODONE-ACETAMINOPHEN 10-325 MG PO TABS: 1.0000 | ORAL_TABLET | ORAL | 0 refills | Status: DC | PRN

## 2019-09-13 NOTE — Addendum Note (Signed)
Addended by: Wallene Huh on: 09/13/2019 05:21 PM   Modules accepted: Orders

## 2019-09-14 ENCOUNTER — Encounter: Payer: Self-pay | Admitting: Podiatry

## 2019-09-14 DIAGNOSIS — M25775 Osteophyte, left foot: Secondary | ICD-10-CM | POA: Diagnosis not present

## 2019-09-14 DIAGNOSIS — M2022 Hallux rigidus, left foot: Secondary | ICD-10-CM | POA: Diagnosis not present

## 2019-09-14 DIAGNOSIS — G4730 Sleep apnea, unspecified: Secondary | ICD-10-CM | POA: Diagnosis not present

## 2019-09-20 ENCOUNTER — Ambulatory Visit: Payer: Self-pay | Attending: Internal Medicine

## 2019-09-20 ENCOUNTER — Encounter: Payer: Self-pay | Admitting: Podiatry

## 2019-09-20 DIAGNOSIS — Z23 Encounter for immunization: Secondary | ICD-10-CM

## 2019-09-20 NOTE — Progress Notes (Signed)
   Covid-19 Vaccination Clinic  Name:  Jerome Kline    MRN: [id number] DOB: 15-Jan-1966  09/20/2019  [name] was observed post Covid-19 immunization for 15 minutes without incident. He was provided with Vaccine Information Sheet and instruction to access the V-Safe system.   [name] was instructed to call 911 with any severe reactions post vaccine: Marland Kitchen Difficulty breathing  . Swelling of face and throat  . A fast heartbeat  . A bad rash all over body  . Dizziness and weakness   Immunizations Administered    Name Date Dose VIS Date Route   Pfizer COVID-19 Vaccine 09/20/2019  4:51 PM 0.3 mL 05/28/2019 Intramuscular   Manufacturer: [place]   Lot: [id number]   [place]: [id number]

## 2019-09-23 ENCOUNTER — Ambulatory Visit (INDEPENDENT_AMBULATORY_CARE_PROVIDER_SITE_OTHER): Payer: BC Managed Care – PPO | Admitting: Podiatry

## 2019-09-23 ENCOUNTER — Encounter: Payer: Self-pay | Admitting: Podiatry

## 2019-09-23 ENCOUNTER — Ambulatory Visit (INDEPENDENT_AMBULATORY_CARE_PROVIDER_SITE_OTHER): Payer: BC Managed Care – PPO

## 2019-09-23 ENCOUNTER — Other Ambulatory Visit: Payer: Self-pay

## 2019-09-23 VITALS — BP 131/75 | HR 64 | Temp 97.3°F | Resp 16

## 2019-09-23 DIAGNOSIS — M205X2 Other deformities of toe(s) (acquired), left foot: Secondary | ICD-10-CM

## 2019-09-23 DIAGNOSIS — G4733 Obstructive sleep apnea (adult) (pediatric): Secondary | ICD-10-CM | POA: Diagnosis not present

## 2019-09-26 DIAGNOSIS — M205X2 Other deformities of toe(s) (acquired), left foot: Secondary | ICD-10-CM | POA: Insufficient documentation

## 2019-09-26 NOTE — Progress Notes (Signed)
Subjective: Jerome Kline is a 54 y.o. is seen today in office s/p left bi-plantar Liane Comber bunionectomy preformed on 09-14-2019 by Dr. Paulla Dolly.  He is doing well.  Still having some swelling and numbness from the incision but overall very minimal discomfort.  He did return to work on Monday has been keeping his foot elevated.  Is been wearing a cam boot.  Denies any systemic complaints such as fevers, chills, nausea, vomiting. No calf pain, chest pain, shortness of breath.   Objective: General: No acute distress, AAOx3  DP/PT pulses palpable 2/4, CRT < 3 sec to all digits.  Protective sensation intact. Motor function intact.  LEFT foot: Incision is well coapted without any evidence of dehiscence incisions healing well without any infection or dehiscence.  There is no surrounding erythema, ascending cellulitis.  There is no drainage or pus.  Mild swelling.  No pain with MPJ range of motion. No other areas of tenderness to bilateral lower extremities.  No other open lesions or pre-ulcerative lesions.  No pain with calf compression, swelling, warmth, erythema.   Assessment and Plan:  Status post left foot surgery, doing well with no complications   -Treatment options discussed including all alternatives, risks, and complications -X-rays obtained reviewed.  Hardware intact status post first metatarsal osteotomy.  No complicating factors. -Per Dr. Mellody Drown instructions he can start to shower and get the incision wet.  Discussed dry thoroughly and apply a small amount of antibiotic ointment.  Transition to surgical shoe which was dispensed today.  Discussed range of motion exercises.  Continue to ice elevate.  Return in about 3 weeks (around 10/14/2019).  Trula Slade DPM

## 2019-09-29 DIAGNOSIS — G4733 Obstructive sleep apnea (adult) (pediatric): Secondary | ICD-10-CM | POA: Diagnosis not present

## 2019-10-13 ENCOUNTER — Ambulatory Visit (INDEPENDENT_AMBULATORY_CARE_PROVIDER_SITE_OTHER): Payer: BC Managed Care – PPO | Admitting: Podiatry

## 2019-10-13 ENCOUNTER — Ambulatory Visit (INDEPENDENT_AMBULATORY_CARE_PROVIDER_SITE_OTHER): Payer: BC Managed Care – PPO

## 2019-10-13 ENCOUNTER — Other Ambulatory Visit: Payer: Self-pay

## 2019-10-13 ENCOUNTER — Encounter: Payer: Self-pay | Admitting: Podiatry

## 2019-10-13 VITALS — Temp 97.2°F

## 2019-10-13 DIAGNOSIS — E781 Pure hyperglyceridemia: Secondary | ICD-10-CM | POA: Diagnosis not present

## 2019-10-13 DIAGNOSIS — M2012 Hallux valgus (acquired), left foot: Secondary | ICD-10-CM | POA: Diagnosis not present

## 2019-10-13 DIAGNOSIS — Z125 Encounter for screening for malignant neoplasm of prostate: Secondary | ICD-10-CM | POA: Diagnosis not present

## 2019-10-13 DIAGNOSIS — Z0000 Encounter for general adult medical examination without abnormal findings: Secondary | ICD-10-CM | POA: Diagnosis not present

## 2019-10-14 NOTE — Progress Notes (Signed)
Subjective:   Patient ID: Jerome Kline, male   DOB: 54 y.o.   MRN: [id number]   HPI Patient states I am doing really well so far with discomfort that still present but it continues to improve and I know I will need long-term orthotics   ROS      Objective:  Physical Exam  Neurovascular status intact excellent range of motion first MPJ no crepitus of the joint noted with incision site healed well negative Bevelyn Buckles' sign noted     Assessment:  Doing well post osteotomy first metatarsal left     Plan:  Reviewed condition recommended continued physical therapy to do at home gradual return to soft shoe gear dispensed compression stocking continue elevation and will be seen back 6 weeks with consideration for orthotics  X-rays indicate osteotomies healing well joint is open with good correction so far

## 2019-10-20 DIAGNOSIS — Z0000 Encounter for general adult medical examination without abnormal findings: Secondary | ICD-10-CM | POA: Diagnosis not present

## 2019-10-20 DIAGNOSIS — E291 Testicular hypofunction: Secondary | ICD-10-CM | POA: Diagnosis not present

## 2019-10-20 DIAGNOSIS — E8881 Metabolic syndrome: Secondary | ICD-10-CM | POA: Diagnosis not present

## 2019-10-20 DIAGNOSIS — Z1331 Encounter for screening for depression: Secondary | ICD-10-CM | POA: Diagnosis not present

## 2019-10-20 DIAGNOSIS — R7302 Impaired glucose tolerance (oral): Secondary | ICD-10-CM | POA: Diagnosis not present

## 2019-10-20 DIAGNOSIS — E781 Pure hyperglyceridemia: Secondary | ICD-10-CM | POA: Diagnosis not present

## 2019-10-21 ENCOUNTER — Encounter: Payer: BC Managed Care – PPO | Admitting: Podiatry

## 2019-11-24 ENCOUNTER — Ambulatory Visit (INDEPENDENT_AMBULATORY_CARE_PROVIDER_SITE_OTHER): Payer: BC Managed Care – PPO

## 2019-11-24 ENCOUNTER — Encounter: Payer: Self-pay | Admitting: Podiatry

## 2019-11-24 ENCOUNTER — Other Ambulatory Visit: Payer: Self-pay

## 2019-11-24 ENCOUNTER — Ambulatory Visit (INDEPENDENT_AMBULATORY_CARE_PROVIDER_SITE_OTHER): Payer: BC Managed Care – PPO | Admitting: Podiatry

## 2019-11-24 DIAGNOSIS — M2012 Hallux valgus (acquired), left foot: Secondary | ICD-10-CM

## 2019-11-25 NOTE — Progress Notes (Signed)
Subjective:   Patient ID: Jerome Kline, male   DOB: 54 y.o.   MRN: [id number]   HPI Patient states overall doing pretty well and patient states that he knows he needs orthotics and he is excited to be able to get them.  He does get swelling if he is on his foot too long   ROS      Objective:  Physical Exam  Neuro vascular status intact with patient found to have good motion of the first MPJ left no crepitus but moderate obesity which is complicating factor for his condition with swelling which is consistent with a postoperative.  With flattened arch noted     Assessment:  Doing well overall postoperatively osteotomy first metatarsal left with flattened arch noted and obesity is complicating factors     Plan:  Reviewed condition and I do recommend long-term orthotics for him for again a hold off until all swelling has reduced and we will see him at next visit.  Reviewed x-rays patient may resume all normal activities and understand swelling is a normal part of the healing process  X-rays indicate that there is good healing of the osteotomy joint is congruent [place] an open

## 2020-01-20 ENCOUNTER — Ambulatory Visit (INDEPENDENT_AMBULATORY_CARE_PROVIDER_SITE_OTHER): Payer: BC Managed Care – PPO

## 2020-01-20 ENCOUNTER — Ambulatory Visit: Payer: BC Managed Care – PPO | Admitting: Podiatry

## 2020-01-20 ENCOUNTER — Encounter: Payer: Self-pay | Admitting: Podiatry

## 2020-01-20 ENCOUNTER — Other Ambulatory Visit: Payer: Self-pay

## 2020-01-20 ENCOUNTER — Ambulatory Visit (INDEPENDENT_AMBULATORY_CARE_PROVIDER_SITE_OTHER): Payer: BC Managed Care – PPO | Admitting: Orthotics

## 2020-01-20 DIAGNOSIS — M205X2 Other deformities of toe(s) (acquired), left foot: Secondary | ICD-10-CM

## 2020-01-20 DIAGNOSIS — M778 Other enthesopathies, not elsewhere classified: Secondary | ICD-10-CM

## 2020-01-20 DIAGNOSIS — M205X1 Other deformities of toe(s) (acquired), right foot: Secondary | ICD-10-CM | POA: Diagnosis not present

## 2020-01-20 DIAGNOSIS — M2012 Hallux valgus (acquired), left foot: Secondary | ICD-10-CM | POA: Diagnosis not present

## 2020-01-20 DIAGNOSIS — M779 Enthesopathy, unspecified: Secondary | ICD-10-CM

## 2020-01-20 NOTE — Progress Notes (Signed)
Patient is here today to be evaluated and cast for CMFO.  Patient has hx of functional hallux limitus (FHL), and needs a supportive orthoses that will plantarflex first ray in order to lower hinge pin of first MPJ and enhance windless effect.  Plan of deep heel cup, hug arch, and reverse mortons extension.  Richy to fab.

## 2020-01-24 NOTE — Progress Notes (Signed)
Subjective:   Patient ID: Jerome Kline, male   DOB: 54 y.o.   MRN: [id number]   HPI Patient presents stating doing well with the left foot that would like to keep this from reoccurring and also he has had symptoms on the right not as intense and he wants to keep them from getting bad   ROS      Objective:  Physical Exam  Neurovascular status intact with patient noted to have well-healing surgical site left first metatarsal with good alignment noted and is noted to have mild restriction right with obesity is complicating factor even though he is working on weight loss     Assessment:  Inflammatory capsulitis mild nature right excellent healing of the osteotomy site left with good range of motion no crepitus     Plan:  Final x-rays reviewed of left and he can return to normal activity and casted him for functional orthotics with reverse Morton's extension to take pressure off the first metatarsal.  Will be seen back when ready  X-rays indicate there is good healing of the osteotomy fixation in place joint congruence

## 2020-02-10 ENCOUNTER — Other Ambulatory Visit: Payer: BC Managed Care – PPO | Admitting: Orthotics

## 2020-02-10 ENCOUNTER — Other Ambulatory Visit: Payer: Self-pay

## 2020-03-01 DIAGNOSIS — Z20822 Contact with and (suspected) exposure to covid-19: Secondary | ICD-10-CM | POA: Diagnosis not present

## 2020-03-01 DIAGNOSIS — J069 Acute upper respiratory infection, unspecified: Secondary | ICD-10-CM | POA: Diagnosis not present

## 2020-03-28 ENCOUNTER — Other Ambulatory Visit: Payer: Self-pay | Admitting: Family Medicine

## 2020-03-28 DIAGNOSIS — I889 Nonspecific lymphadenitis, unspecified: Secondary | ICD-10-CM

## 2020-03-28 DIAGNOSIS — J0100 Acute maxillary sinusitis, unspecified: Secondary | ICD-10-CM

## 2020-04-06 ENCOUNTER — Other Ambulatory Visit: Payer: Self-pay

## 2020-04-06 ENCOUNTER — Ambulatory Visit (INDEPENDENT_AMBULATORY_CARE_PROVIDER_SITE_OTHER): Payer: BC Managed Care – PPO | Admitting: Otolaryngology

## 2020-04-06 DIAGNOSIS — J310 Chronic rhinitis: Secondary | ICD-10-CM | POA: Diagnosis not present

## 2020-04-06 DIAGNOSIS — K0889 Other specified disorders of teeth and supporting structures: Secondary | ICD-10-CM | POA: Diagnosis not present

## 2020-04-06 NOTE — Progress Notes (Signed)
HPI: Jerome Kline is a 54 y.o. male who presents is referred by his PCP for evaluation of left-sided sinus problems and apparently swollen lymph nodes.  This began initially about 3 weeks ago with some pain in a left upper molar when he chewed.  He was seen by endodontist who did x-rays and thought it was more related to his sinuses.  He was treated with 2 rounds of amoxicillin but continued to have pain when he chewed with some discomfort up into the left cheek area.  He was then placed on Levaquin which helped a little bit but did not totally resolve symptoms.  Presently he is on clindamycin that he started 3 days ago and is scheduled for a CT scan of the sinuses next Tuesday.  The clindamycin seems to have helped the most.  He also had noted some swelling of lymph nodes in his neck that have gone down.  But the area he points to his neck actually represents the submandibular glands. He does have history of allergies for which he takes Singulair and Xyzal.  He is not using any allergy or nasal steroid sprays.  He has previously seen Dr. Ernesto Rutherford and states that the use of Singulair and Xyzal adequately controls his allergy symptoms. He denies any yellow-green discharge from his nose.Marland Kitchen  Past Medical History:  Diagnosis Date  . Asthma    allergy induced asthma but doesn't use inhaler  . Dyslipidemia    takes Trilipix daily  . ED (erectile dysfunction)   . Headache(784.0)    occasionally  . Hemorrhoids   . Hernia    ventral  . History of bronchitis   . History of kidney stones   . Joint pain    knees/hips/shoulders  . OSA (obstructive sleep apnea)    on CPAP  . Pneumonia    last time about 16yrs ago  . PONV (postoperative nausea and vomiting)   . Seasonal allergies    takes Claritin daily   Past Surgical History:  Procedure Laterality Date  . ADENOIDECTOMY    . CERVICAL FUSION  12/10   C4-5 and C6-7  . CERVICAL SPINE SURGERY    . CHOLECYSTECTOMY  1998   Lap Chole  . COLONOSCOPY     . ESOPHAGOGASTRODUODENOSCOPY    . INSERTION OF MESH N/A 06/14/2013   Procedure: INSERTION OF MESH;  Surgeon: Zenovia Jarred, MD;  Location: [facility];  Service: General;  Laterality: N/A;  . KNEE ARTHROSCOPY W/ ACL RECONSTRUCTION    . MYRINGOTOMY     x 2  . NOSE SURGERY    . NOSE SURGERY    . SHOULDER ARTHROSCOPY WITH ROTATOR CUFF REPAIR     Lt shoulder  . VENTRAL HERNIA REPAIR N/A 06/14/2013   Procedure: LAPAROSCOPIC VENTRAL HERNIA;  Surgeon: Zenovia Jarred, MD;  Location: [facility];  Service: General;  Laterality: N/A;   Social History   Socioeconomic History  . Marital status: Married    Spouse name: Engineer, civil (consulting)  . Number of children: 1  . Years of education: college  . Highest education level: Not on file  Occupational History  . Occupation: [employer]    Employer: [employer]  Tobacco Use  . Smoking status: Never Smoker  . Smokeless tobacco: Never Used  Substance and Sexual Activity  . Alcohol use: No  . Drug use: No  . Sexual activity: Yes  Other Topics Concern  . Not on file  Social History Narrative  . Not  on file   Social Determinants of Health   Financial Resource Strain:   . Difficulty of Paying Living Expenses: Not on file  Food Insecurity:   . Worried About Charity fundraiser in the Last Year: Not on file  . Ran Out of Food in the Last Year: Not on file  Transportation Needs:   . Lack of Transportation (Medical): Not on file  . Lack of Transportation (Non-Medical): Not on file  Physical Activity:   . Days of Exercise per Week: Not on file  . Minutes of Exercise per Session: Not on file  Stress:   . Feeling of Stress : Not on file  Social Connections:   . Frequency of Communication with Friends and Family: Not on file  . Frequency of Social Gatherings with Friends and Family: Not on file  . Attends Religious Services: Not on file  . Active Member of Clubs or Organizations: Not on file  . Attends Archivist Meetings:  Not on file  . Marital Status: Not on file   Family History  Problem Relation Age of Onset  . Heart disease Maternal Grandmother   . Heart disease Paternal Grandfather    No Known Allergies Prior to Admission medications   Medication Sig Start Date End Date Taking? Authorizing Provider  Choline Fenofibrate (TRILIPIX) 135 MG capsule Take 135 mg by mouth daily.    [provider]  fenofibrate 160 MG tablet Take 160 mg by mouth daily. 01/01/20   [provider]  levocetirizine (XYZAL) 5 MG tablet Take 5 mg by mouth daily.    [provider]  montelukast (SINGULAIR) 10 MG tablet Take 10 mg by mouth at bedtime.    [provider]  Omega-3 Fatty Acids (FISH OIL) 1000 MG CAPS Take 1,000 mg by mouth daily.    [provider]     Positive ROS: Otherwise negative  All other systems have been reviewed and were otherwise negative with the exception of those mentioned in the HPI and as above.  Physical Exam: Constitutional: Alert, well-appearing, no acute distress Ears: External ears without lesions or tenderness. Ear canals are clear bilaterally with intact, clear TMs.  Nasal: External nose without lesions. Septum with minimal deformity and moderate rhinitis..  Nasal endoscopy was performed bilaterally.  On nasal endoscopy the left middle meatus region was clear with no evidence of mucopurulent discharge.  The mucus within the nasal cavity was all clear.  The nasopharynx was clear.  Right nasal passageway as well as right middle meatus was clear.  The laryngoscope was passed through the nasopharynx and the vallecula epiglottis and vocal cords were clear.  No hypopharyngeal or laryngeal mucosal abnormalities noted. Oral: Lips and gums without lesions. Tongue and palate mucosa without lesions. Posterior oropharynx clear. Neck: No palpable adenopathy or masses Respiratory: Breathing comfortably  Skin: No facial/neck lesions or rash noted.  Nasal/sinus  endoscopy  Date/Time: 04/06/2020 3:22 PM Performed by: Rozetta Nunnery, MD Authorized by: Rozetta Nunnery, MD   Consent:    Consent obtained:  Verbal   Consent given by:  Patient Procedure details:    Indications: sino-nasal symptoms     Medication:  Afrin   Instrument: flexible fiberoptic nasal endoscope     Scope location: bilateral nare   Septum:    normal   Sinus:    Right middle meatus: normal     Left middle meatus: normal     Right nasopharynx: normal     Left nasopharynx:  normal   Mouth:    Base of tongue: normal     Epiglottis: normal   Throat:    True vocal cords: normal   Comments:     On nasal endoscopy both middle meatus regions were clear with no clinical evidence of acute sinus infection or mucopurulent discharge.  He does have mild rhinitis with clear mucus discharge throughout the nasal cavity.    Assessment: Left dental pain and left cheek pain I suspect is probably more dental in origin than sinus. He does have chronic rhinitis but no clinical evidence of acute sinus infection.  Plan: He will complete the remaining clindamycin.  Also prescribed Nasacort 2 sprays each nostril at night as this will help with nasal congestion as well as sinus ostia congestion. He will call us next Wednesday after he gets the CT scan of the sinuses performed on Tuesday of next week.   Radene Journey, MD   CC:

## 2020-04-11 ENCOUNTER — Other Ambulatory Visit: Payer: Self-pay

## 2020-04-11 ENCOUNTER — Ambulatory Visit
Admission: RE | Admit: 2020-04-11 | Discharge: 2020-04-11 | Disposition: A | Discharge status: Home / Self Care | Payer: BC Managed Care – PPO | Source: Ambulatory Visit | Attending: Family Medicine | Admitting: Family Medicine

## 2020-04-11 DIAGNOSIS — I889 Nonspecific lymphadenitis, unspecified: Secondary | ICD-10-CM

## 2020-04-11 DIAGNOSIS — J0100 Acute maxillary sinusitis, unspecified: Secondary | ICD-10-CM

## 2020-04-11 DIAGNOSIS — K0889 Other specified disorders of teeth and supporting structures: Secondary | ICD-10-CM | POA: Diagnosis not present

## 2020-04-11 MED ORDER — IOPAMIDOL (ISOVUE-300) INJECTION 61%
75.0000 mL | Freq: Once | INTRAVENOUS | Status: AC | PRN
  Administered 2020-04-11: 75 mL via INTRAVENOUS

## 2020-04-12 ENCOUNTER — Telehealth (INDEPENDENT_AMBULATORY_CARE_PROVIDER_SITE_OTHER): Payer: Self-pay | Admitting: Otolaryngology

## 2020-04-12 NOTE — Telephone Encounter (Signed)
Jerome Kline called concerning results of his recent sinus CT scan.  I reviewed this with him.  It showed clear paranasal sinuses with periapical erosion of tooth #18. Recommended follow-up with his dentist or oral surgeon concerning further treatment.

## 2020-06-29 DIAGNOSIS — Z20822 Contact with and (suspected) exposure to covid-19: Secondary | ICD-10-CM | POA: Diagnosis not present

## 2020-08-23 DIAGNOSIS — G4733 Obstructive sleep apnea (adult) (pediatric): Secondary | ICD-10-CM | POA: Diagnosis not present

## 2020-10-03 DIAGNOSIS — G4733 Obstructive sleep apnea (adult) (pediatric): Secondary | ICD-10-CM | POA: Diagnosis not present

## 2020-11-10 DIAGNOSIS — G4733 Obstructive sleep apnea (adult) (pediatric): Secondary | ICD-10-CM | POA: Diagnosis not present

## 2020-11-28 ENCOUNTER — Ambulatory Visit: Payer: BC Managed Care – PPO | Admitting: Physician Assistant

## 2020-12-27 ENCOUNTER — Other Ambulatory Visit: Payer: Self-pay

## 2020-12-27 ENCOUNTER — Ambulatory Visit: Payer: BC Managed Care – PPO | Admitting: Physician Assistant

## 2020-12-27 ENCOUNTER — Encounter: Payer: Self-pay | Admitting: Physician Assistant

## 2020-12-27 DIAGNOSIS — B360 Pityriasis versicolor: Secondary | ICD-10-CM | POA: Diagnosis not present

## 2020-12-27 DIAGNOSIS — L821 Other seborrheic keratosis: Secondary | ICD-10-CM | POA: Diagnosis not present

## 2020-12-27 DIAGNOSIS — L570 Actinic keratosis: Secondary | ICD-10-CM | POA: Diagnosis not present

## 2020-12-27 DIAGNOSIS — Z1283 Encounter for screening for malignant neoplasm of skin: Secondary | ICD-10-CM | POA: Diagnosis not present

## 2020-12-27 MED ORDER — FLUCONAZOLE 200 MG PO TABS: 200.0000 mg | ORAL_TABLET | Freq: Every day | ORAL | 0 refills | Status: DC

## 2020-12-27 MED ORDER — KETOCONAZOLE 2 % EX SHAM: 1.0000 "application " | MEDICATED_SHAMPOO | Freq: Once | CUTANEOUS | 3 refills | Status: AC

## 2020-12-27 NOTE — Progress Notes (Signed)
   New Patient   Subjective  Jerome Kline is a 55 y.o. male who presents for the following: Annual Exam (Here for annual skin exam. Concerns arms, back and chest area. Also wants his scalp checked really well. ). Has a rash for at least 2-3 months. No itch. Just pink and persistent. He tried numerous OTC products without success.    The following portions of the chart were reviewed this encounter and updated as appropriate:  Tobacco  Allergies  Meds  Problems  Med Hx  Surg Hx  Fam Hx       Objective  Well appearing patient in no apparent distress; mood and affect are within normal limits.  A full examination was performed including scalp, head, eyes, ears, nose, lips, neck, chest, axillae, abdomen, back, buttocks, bilateral upper extremities, bilateral lower extremities, hands, feet, fingers, toes, fingernails, and toenails. All findings within normal limits unless otherwise noted below.  neck, back, shoulders and abdomen Confluent oval, pink plaques.  Stuck-on, brown and crusty plaques.   Left Forehead (4), Mid Forehead, Right Ear Erythematous patches with gritty scale.   Assessment & Plan  AK (actinic keratosis) (6) Right Ear; Mid Forehead; Left Forehead (4)  Destruction of lesion - Left Forehead, Mid Forehead, Right Ear Complexity: simple   Destruction method: cryotherapy   Informed consent: discussed and consent obtained   Timeout:  patient name, date of birth, surgical site, and procedure verified Lesion destroyed using liquid nitrogen: Yes   Cryotherapy cycles:  1 Outcome: patient tolerated procedure well with no complications   Post-procedure details: wound care instructions given    Tinea versicolor neck, back, shoulders and abdomen  fluconazole (DIFLUCAN) 200 MG tablet - neck, back, shoulders and abdomen Take 1 tablet (200 mg total) by mouth daily. Take one every 3 days  ketoconazole (NIZORAL) 2 % shampoo - neck, back, shoulders and abdomen Apply 1  application topically once for 1 dose.  Seborrheic keratosis  Benign no treatment needed. Return to clinic if changes.      I, Eloisa Chokshi, PA-C, have reviewed all documentation's for this visit.  The documentation on 12/27/20 for the exam, diagnosis, procedures and orders are all accurate and complete.

## 2021-01-29 DIAGNOSIS — G4733 Obstructive sleep apnea (adult) (pediatric): Secondary | ICD-10-CM | POA: Diagnosis not present

## 2021-04-16 DIAGNOSIS — Z125 Encounter for screening for malignant neoplasm of prostate: Secondary | ICD-10-CM | POA: Diagnosis not present

## 2021-04-16 DIAGNOSIS — E119 Type 2 diabetes mellitus without complications: Secondary | ICD-10-CM | POA: Diagnosis not present

## 2021-04-16 DIAGNOSIS — E291 Testicular hypofunction: Secondary | ICD-10-CM | POA: Diagnosis not present

## 2021-04-23 DIAGNOSIS — Z0000 Encounter for general adult medical examination without abnormal findings: Secondary | ICD-10-CM | POA: Diagnosis not present

## 2021-04-23 DIAGNOSIS — E119 Type 2 diabetes mellitus without complications: Secondary | ICD-10-CM | POA: Diagnosis not present

## 2021-04-23 DIAGNOSIS — Z1331 Encounter for screening for depression: Secondary | ICD-10-CM | POA: Diagnosis not present

## 2021-04-23 DIAGNOSIS — Z1339 Encounter for screening examination for other mental health and behavioral disorders: Secondary | ICD-10-CM | POA: Diagnosis not present

## 2021-04-23 DIAGNOSIS — R82998 Other abnormal findings in urine: Secondary | ICD-10-CM | POA: Diagnosis not present

## 2021-04-23 DIAGNOSIS — Z23 Encounter for immunization: Secondary | ICD-10-CM | POA: Diagnosis not present

## 2021-06-06 DIAGNOSIS — E119 Type 2 diabetes mellitus without complications: Secondary | ICD-10-CM | POA: Diagnosis not present

## 2021-06-10 IMAGING — CT CT MAXILLOFACIAL W/ CM
1 series · 16 of 30 positions shown, 20 images · IV contrast (APPLIED)
Comparison: None available

CLINICAL DATA: Pressure and teeth pain, question acute sinusitis
versus lymphadenitis.

EXAM:
CT MAXILLOFACIAL WITH CONTRAST
TECHNIQUE: Multidetector CT imaging of the maxillofacial structures was
performed with intravenous contrast. Multiplanar CT image
reconstructions were also generated.
CONTRAST:  75mL [id number] IOPAMIDOL ([id number]) INJECTION 61%

[Series 4: maxofacial -soft · axial · 0.40mm/px · z∈[-212,-28]mm · 16 of 100 slices shown, 20 images]
[im 4/100  brain]
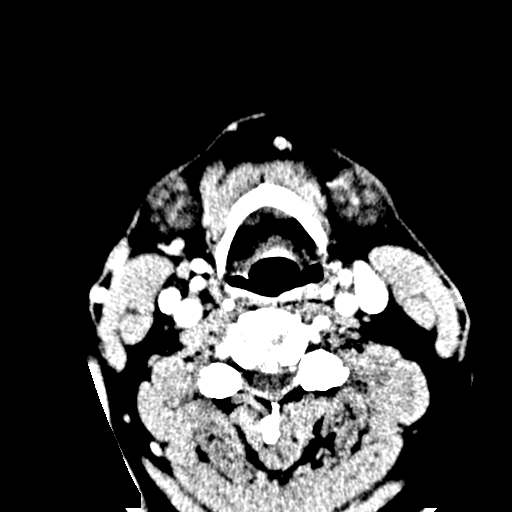
[im 4/100  bone]
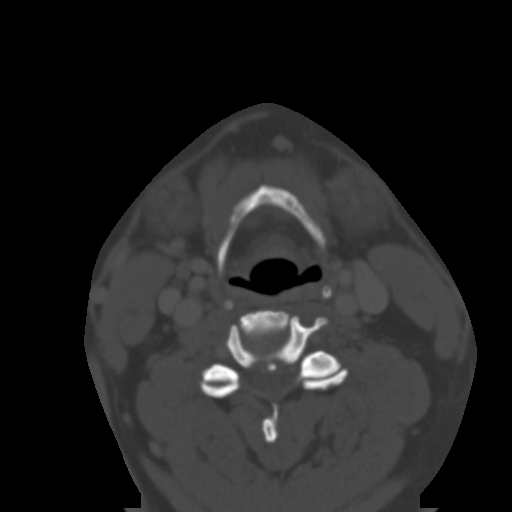
[im 11/100  bone]
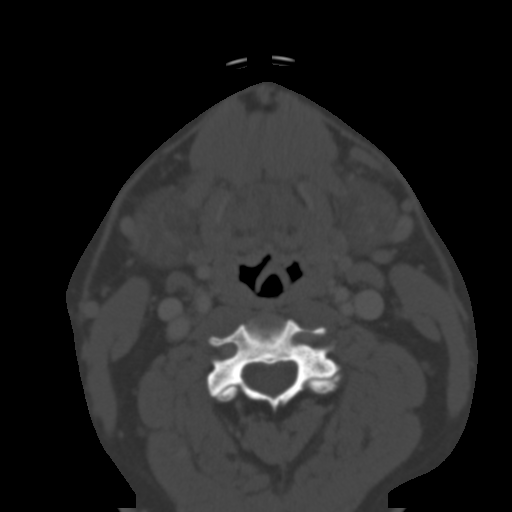
[im 18/100  bone]
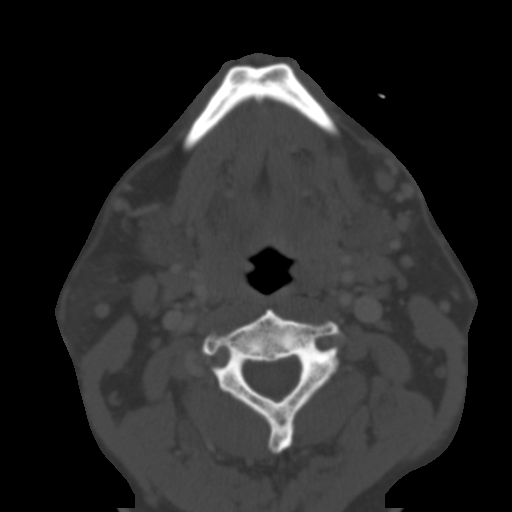
[im 24/100  bone]
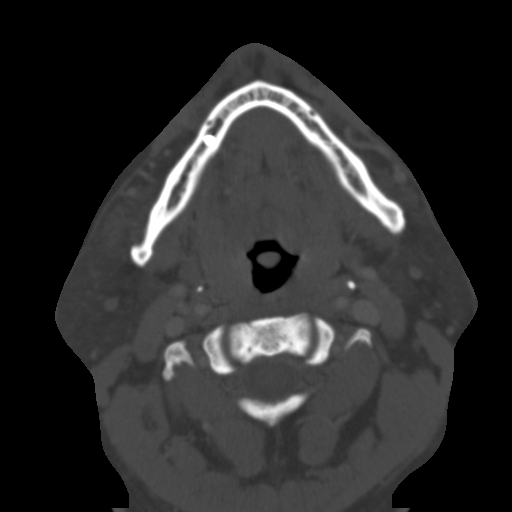
[im 28/100  brain]
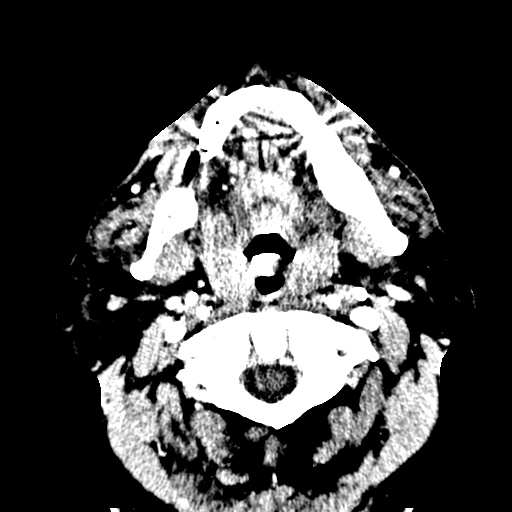
[im 28/100  bone]
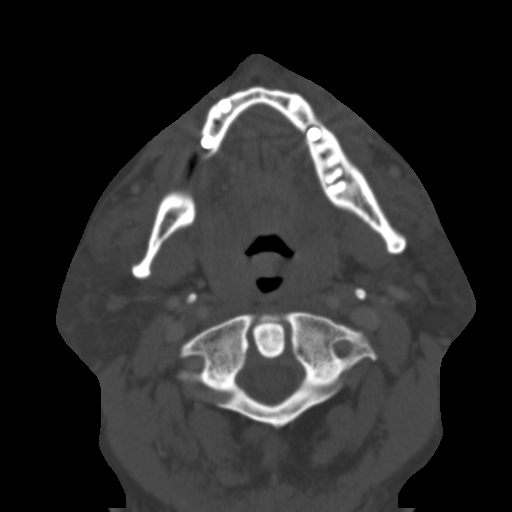
[im 35/100  bone]
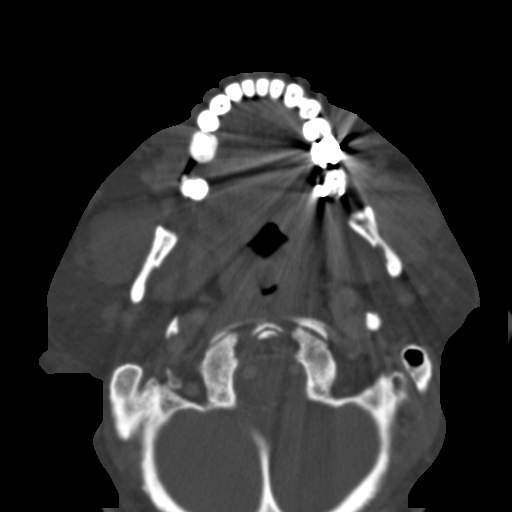
[im 41/100  bone]
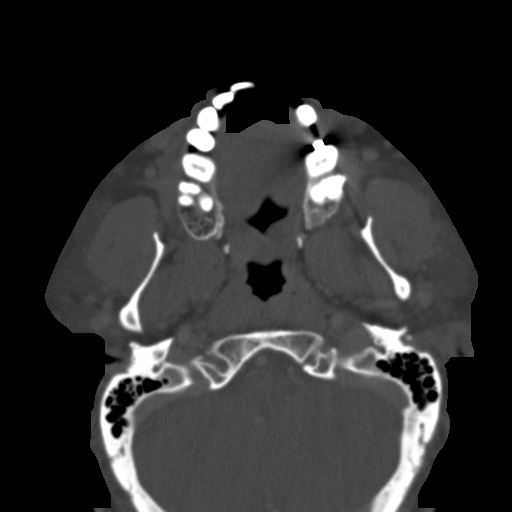
[im 48/100  bone]
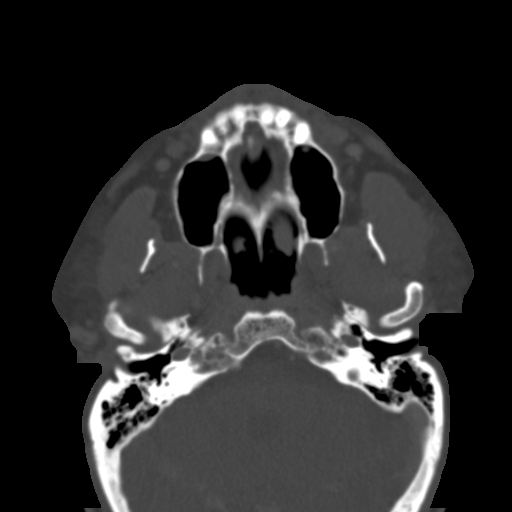
[im 52/100  brain]
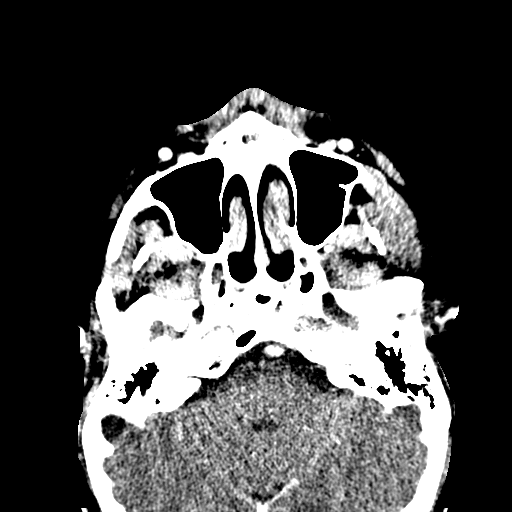
[im 52/100  bone]
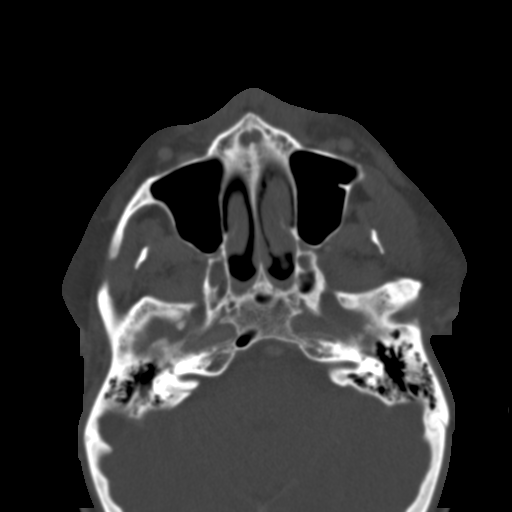
[im 59/100  bone]
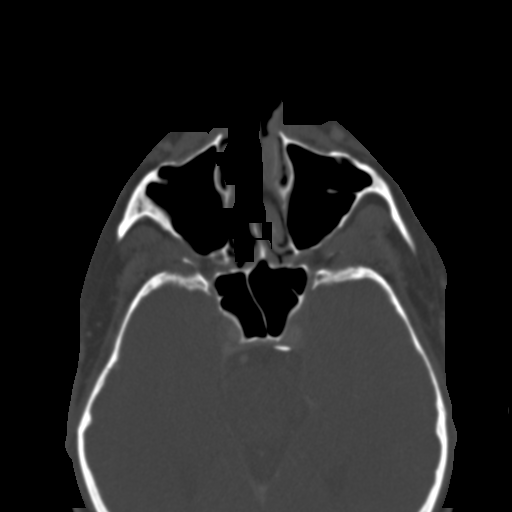
[im 65/100  bone]
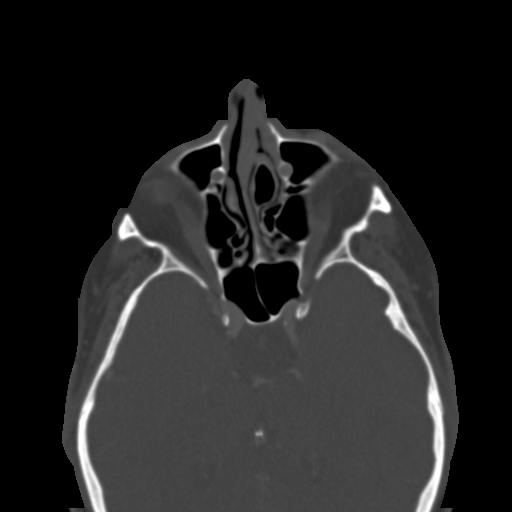
[im 72/100  bone]
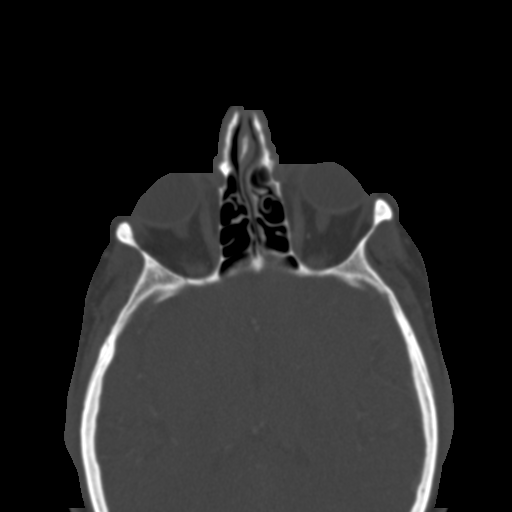
[im 76/100  brain]
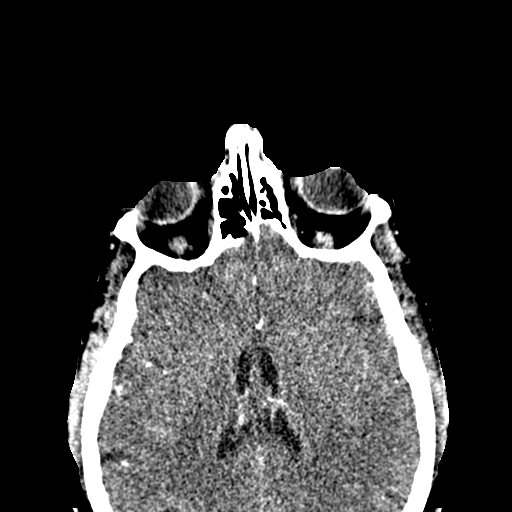
[im 76/100  bone]
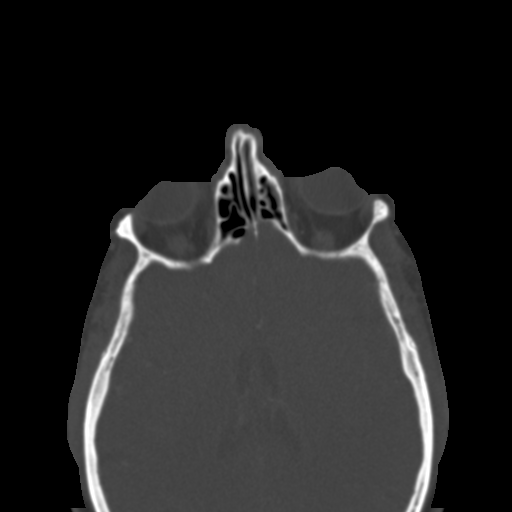
[im 82/100  bone]
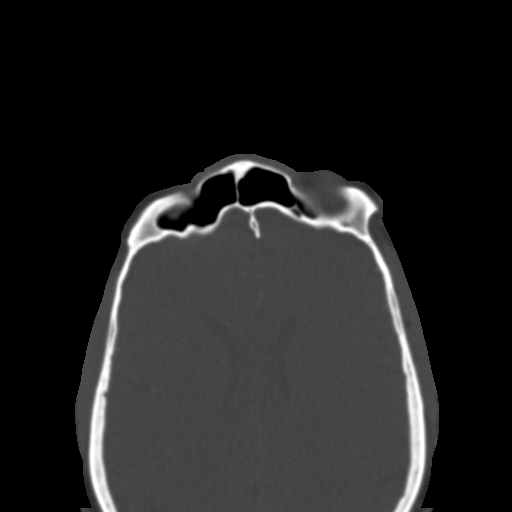
[im 89/100  bone]
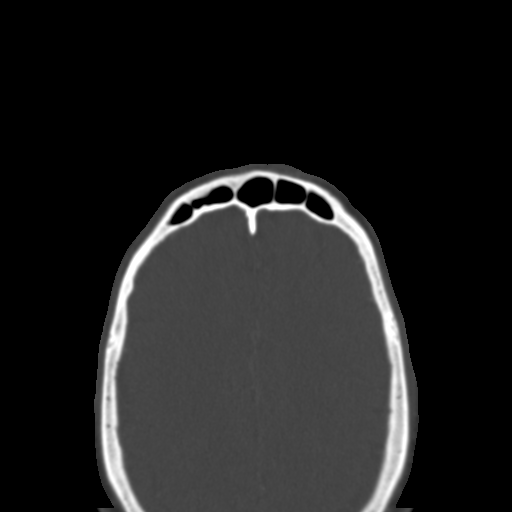
[im 96/100  bone]
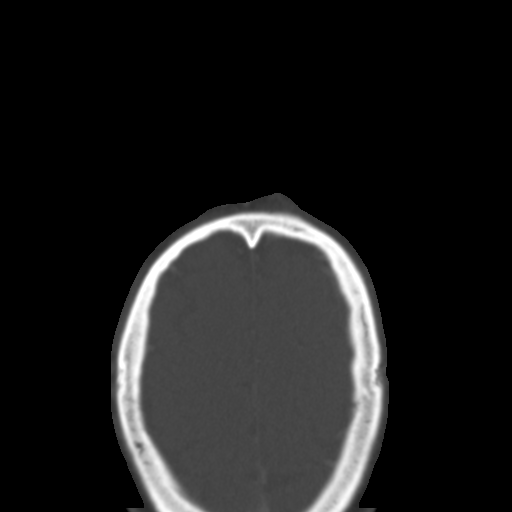

[16 of 30 positions shown; findings below may reference images not displayed]

FINDINGS: Osseous: No evidence of fracture or bone lesion. Mandibular dental
implants without complicating feature. Periapical erosion at tooth
18. No fracture or evident bone lesion

Orbits: Negative. No traumatic or inflammatory finding.

Sinuses: Clear

Soft tissues: No evidence of inflammation or mass. By history there
is concern for lymphadenitis, which is not seen.

Limited intracranial: Negative
IMPRESSION: 1. Periapical erosion associated with tooth 18.
2. No sinusitis.

## 2021-08-02 DIAGNOSIS — J0100 Acute maxillary sinusitis, unspecified: Secondary | ICD-10-CM | POA: Diagnosis not present

## 2021-08-02 DIAGNOSIS — H66011 Acute suppurative otitis media with spontaneous rupture of ear drum, right ear: Secondary | ICD-10-CM | POA: Diagnosis not present

## 2021-08-20 DIAGNOSIS — H6690 Otitis media, unspecified, unspecified ear: Secondary | ICD-10-CM | POA: Diagnosis not present

## 2021-08-27 DIAGNOSIS — H7291 Unspecified perforation of tympanic membrane, right ear: Secondary | ICD-10-CM | POA: Diagnosis not present

## 2021-08-30 DIAGNOSIS — H60391 Other infective otitis externa, right ear: Secondary | ICD-10-CM | POA: Diagnosis not present

## 2021-08-31 DIAGNOSIS — E119 Type 2 diabetes mellitus without complications: Secondary | ICD-10-CM | POA: Diagnosis not present

## 2021-10-02 DIAGNOSIS — G4733 Obstructive sleep apnea (adult) (pediatric): Secondary | ICD-10-CM | POA: Diagnosis not present

## 2021-12-27 ENCOUNTER — Ambulatory Visit: Payer: BC Managed Care – PPO | Admitting: Physician Assistant

## 2021-12-27 ENCOUNTER — Encounter: Payer: Self-pay | Admitting: Physician Assistant

## 2021-12-27 DIAGNOSIS — L570 Actinic keratosis: Secondary | ICD-10-CM

## 2021-12-27 DIAGNOSIS — Z1283 Encounter for screening for malignant neoplasm of skin: Secondary | ICD-10-CM | POA: Diagnosis not present

## 2021-12-27 DIAGNOSIS — D485 Neoplasm of uncertain behavior of skin: Secondary | ICD-10-CM

## 2021-12-27 DIAGNOSIS — L918 Other hypertrophic disorders of the skin: Secondary | ICD-10-CM

## 2021-12-27 NOTE — Patient Instructions (Signed)

## 2022-01-01 DIAGNOSIS — E119 Type 2 diabetes mellitus without complications: Secondary | ICD-10-CM | POA: Diagnosis not present

## 2022-01-07 ENCOUNTER — Telehealth: Payer: Self-pay

## 2022-01-07 NOTE — Telephone Encounter (Signed)
-----   Message from Warren Danes, [place] sent at 01/03/2022  6:46 PM EDT ----- Rtc if recurs

## 2022-01-07 NOTE — Telephone Encounter (Signed)
Phone call to patient with his pathology results. Patient aware of results.  

## 2022-01-17 ENCOUNTER — Encounter: Payer: Self-pay | Admitting: Physician Assistant

## 2022-01-17 NOTE — Progress Notes (Signed)
   Follow-Up Visit   Subjective  Jerome Kline is a 56 y.o. male who presents for the following: Annual Exam (Skin tags chest and under arms he wants removed and follow up on the scalp, left post knee wart/isk x 1 year.).   The following portions of the chart were reviewed this encounter and updated as appropriate:  Tobacco  Allergies  Meds  Problems  Med Hx  Surg Hx  Fam Hx      Objective  Well appearing patient in no apparent distress; mood and affect are within normal limits.  All skin waist up examined.  No atypical nevi noted at the time of the visit.   Mid Forehead Hyperkeratotic scale with pink base        Left Axilla (8), Right Axilla (3) Fleshy, skin-colored sessile and pedunculated papules.     Assessment & Plan  Encounter for screening for malignant neoplasm of skin  Yearly skin exam  Neoplasm of uncertain behavior of skin Mid Forehead  Skin / nail biopsy Type of biopsy: tangential   Informed consent: discussed and consent obtained   Timeout: patient name, date of birth, surgical site, and procedure verified   Procedure prep:  Patient was prepped and draped in usual sterile fashion (Non sterile) Prep type:  Chlorhexidine Anesthesia: the lesion was anesthetized in a standard fashion   Anesthetic:  1% lidocaine w/ epinephrine 1-100,000 local infiltration Instrument used: flexible razor blade   Outcome: patient tolerated procedure well   Post-procedure details: wound care instructions given   Additional details:  Cautery only  Specimen 1 - Surgical pathology Differential Diagnosis: bcc vs scc  Check Margins: yes  Skin tag (11) Left Axilla (8); Right Axilla (3)  Destruction of lesion - Left Axilla, Right Axilla Complexity: simple   Destruction method comment:  Scissors were used to snip tag at the base Informed consent: discussed and consent obtained   Timeout:  patient name, date of birth, surgical site, and procedure  verified Anesthesia: the lesion was anesthetized in a standard fashion   Hemostasis achieved with:  pressure Outcome: patient tolerated procedure well with no complications   Post-procedure details: wound care instructions given      I, Annslee Tercero, PA-C, have reviewed all documentation's for this visit.  The documentation on 01/17/22 for the exam, diagnosis, procedures and orders are all accurate and complete.

## 2022-02-13 DIAGNOSIS — E119 Type 2 diabetes mellitus without complications: Secondary | ICD-10-CM | POA: Diagnosis not present

## 2022-05-06 DIAGNOSIS — E291 Testicular hypofunction: Secondary | ICD-10-CM | POA: Diagnosis not present

## 2022-05-06 DIAGNOSIS — E781 Pure hyperglyceridemia: Secondary | ICD-10-CM | POA: Diagnosis not present

## 2022-05-06 DIAGNOSIS — E119 Type 2 diabetes mellitus without complications: Secondary | ICD-10-CM | POA: Diagnosis not present

## 2022-05-17 DIAGNOSIS — I10 Essential (primary) hypertension: Secondary | ICD-10-CM | POA: Diagnosis not present

## 2022-05-17 DIAGNOSIS — Z1331 Encounter for screening for depression: Secondary | ICD-10-CM | POA: Diagnosis not present

## 2022-05-17 DIAGNOSIS — E8881 Metabolic syndrome: Secondary | ICD-10-CM | POA: Diagnosis not present

## 2022-05-17 DIAGNOSIS — E119 Type 2 diabetes mellitus without complications: Secondary | ICD-10-CM | POA: Diagnosis not present

## 2022-05-17 DIAGNOSIS — Z1339 Encounter for screening examination for other mental health and behavioral disorders: Secondary | ICD-10-CM | POA: Diagnosis not present

## 2022-05-17 DIAGNOSIS — Z0000 Encounter for general adult medical examination without abnormal findings: Secondary | ICD-10-CM | POA: Diagnosis not present

## 2022-05-17 DIAGNOSIS — R82998 Other abnormal findings in urine: Secondary | ICD-10-CM | POA: Diagnosis not present

## 2022-05-17 DIAGNOSIS — Z23 Encounter for immunization: Secondary | ICD-10-CM | POA: Diagnosis not present

## 2022-07-04 DIAGNOSIS — E119 Type 2 diabetes mellitus without complications: Secondary | ICD-10-CM | POA: Diagnosis not present

## 2022-07-18 DIAGNOSIS — S83282D Other tear of lateral meniscus, current injury, left knee, subsequent encounter: Secondary | ICD-10-CM | POA: Diagnosis not present

## 2022-07-18 DIAGNOSIS — S83282A Other tear of lateral meniscus, current injury, left knee, initial encounter: Secondary | ICD-10-CM | POA: Diagnosis not present

## 2022-07-18 DIAGNOSIS — M1711 Unilateral primary osteoarthritis, right knee: Secondary | ICD-10-CM | POA: Diagnosis not present

## 2022-07-18 DIAGNOSIS — M25562 Pain in left knee: Secondary | ICD-10-CM | POA: Diagnosis not present

## 2022-07-18 DIAGNOSIS — M1712 Unilateral primary osteoarthritis, left knee: Secondary | ICD-10-CM | POA: Diagnosis not present

## 2022-07-18 DIAGNOSIS — M7062 Trochanteric bursitis, left hip: Secondary | ICD-10-CM | POA: Diagnosis not present

## 2022-08-06 DIAGNOSIS — M25562 Pain in left knee: Secondary | ICD-10-CM | POA: Diagnosis not present

## 2022-08-06 DIAGNOSIS — S83282A Other tear of lateral meniscus, current injury, left knee, initial encounter: Secondary | ICD-10-CM | POA: Diagnosis not present

## 2022-08-06 DIAGNOSIS — S83282D Other tear of lateral meniscus, current injury, left knee, subsequent encounter: Secondary | ICD-10-CM | POA: Diagnosis not present

## 2022-08-29 DIAGNOSIS — S83242A Other tear of medial meniscus, current injury, left knee, initial encounter: Secondary | ICD-10-CM | POA: Diagnosis not present

## 2022-09-04 DIAGNOSIS — G4733 Obstructive sleep apnea (adult) (pediatric): Secondary | ICD-10-CM | POA: Diagnosis not present

## 2022-09-06 DIAGNOSIS — M25562 Pain in left knee: Secondary | ICD-10-CM | POA: Diagnosis not present

## 2022-09-09 DIAGNOSIS — E119 Type 2 diabetes mellitus without complications: Secondary | ICD-10-CM | POA: Diagnosis not present

## 2022-09-20 DIAGNOSIS — S83242A Other tear of medial meniscus, current injury, left knee, initial encounter: Secondary | ICD-10-CM | POA: Diagnosis not present

## 2022-09-20 DIAGNOSIS — S83282A Other tear of lateral meniscus, current injury, left knee, initial encounter: Secondary | ICD-10-CM | POA: Diagnosis not present

## 2022-09-26 ENCOUNTER — Encounter (HOSPITAL_BASED_OUTPATIENT_CLINIC_OR_DEPARTMENT_OTHER): Payer: Self-pay | Admitting: Orthopaedic Surgery

## 2022-09-26 ENCOUNTER — Other Ambulatory Visit: Payer: Self-pay

## 2022-10-02 ENCOUNTER — Encounter (HOSPITAL_BASED_OUTPATIENT_CLINIC_OR_DEPARTMENT_OTHER)
Admission: RE | Admit: 2022-10-02 | Discharge: 2022-10-02 | Disposition: A | Discharge status: Home / Self Care | Payer: BC Managed Care – PPO | Source: Ambulatory Visit | Attending: Orthopaedic Surgery | Admitting: Orthopaedic Surgery

## 2022-10-02 ENCOUNTER — Encounter (HOSPITAL_BASED_OUTPATIENT_CLINIC_OR_DEPARTMENT_OTHER): Payer: Self-pay | Admitting: Orthopaedic Surgery

## 2022-10-02 DIAGNOSIS — G4733 Obstructive sleep apnea (adult) (pediatric): Secondary | ICD-10-CM | POA: Diagnosis not present

## 2022-10-02 DIAGNOSIS — X501XXA Overexertion from prolonged static or awkward postures, initial encounter: Secondary | ICD-10-CM | POA: Diagnosis not present

## 2022-10-02 DIAGNOSIS — E119 Type 2 diabetes mellitus without complications: Secondary | ICD-10-CM | POA: Diagnosis not present

## 2022-10-02 DIAGNOSIS — Z6841 Body Mass Index (BMI) 40.0 and over, adult: Secondary | ICD-10-CM | POA: Insufficient documentation

## 2022-10-02 DIAGNOSIS — Z7984 Long term (current) use of oral hypoglycemic drugs: Secondary | ICD-10-CM | POA: Diagnosis not present

## 2022-10-02 DIAGNOSIS — S83242A Other tear of medial meniscus, current injury, left knee, initial encounter: Secondary | ICD-10-CM | POA: Diagnosis not present

## 2022-10-02 LAB — BASIC METABOLIC PANEL
Anion gap: 10 (ref 5–15)
BUN: 15 mg/dL (ref 6–20)
CO2: 24 mmol/L (ref 22–32)
Calcium: 9 mg/dL (ref 8.9–10.3)
Chloride: 105 mmol/L (ref 98–111)
Creatinine, Ser: 0.91 mg/dL (ref 0.61–1.24)
GFR, Estimated: >60 mL/min (ref >60)
Glucose, Bld: 112 mg/dL — ABNORMAL HIGH (ref 70–99)
Potassium: 3.7 mmol/L (ref 3.5–5.1)
Sodium: 139 mmol/L (ref 135–145)

## 2022-10-02 NOTE — H&P (Signed)
PREOPERATIVE H&P  Chief Complaint: left knee medial meniscus tear, synovitis  HPI: Jerome Kline is a 57 y.o. male who is scheduled for, Procedure(s): KNEE ARTHROSCOPY WITH MEDIAL MENISECTOMY SYNOVECTOMY.   Patient has a past medical history significant for DM, OSA on CPAP.   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 Left knee has had issues since January. He had a twisting injury and now has mechanical catching and locking. He has been unable to play golf.   Symptoms are rated as moderate to severe, and have been worsening.  This is significantly impairing activities of daily living.    Please see clinic note for further details on this patient's care.    He has elected for surgical management.   Past Medical History:  Diagnosis Date   Diabetes mellitus without complication    Dyslipidemia    ED (erectile dysfunction)    Hemorrhoids    Hernia    ventral   History of bronchitis    History of kidney stones    Joint pain    knees/hips/shoulders   OSA (obstructive sleep apnea)    on CPAP nightly   Pneumonia    last time about 44yrs ago   Seasonal allergies    Past Surgical History:  Procedure Laterality Date   ADENOIDECTOMY     BUNIONECTOMY Right    CERVICAL FUSION  05/17/2009   C4-5 and C6-7   CERVICAL SPINE SURGERY     CHOLECYSTECTOMY  06/17/1996   Lap Chole   COLONOSCOPY     ESOPHAGOGASTRODUODENOSCOPY     INSERTION OF MESH N/A 06/14/2013   Procedure: INSERTION OF MESH;  Surgeon: Liz Malady, MD;  Location: [facility];  Service: General;  Laterality: N/A;   KNEE ARTHROSCOPY W/ ACL RECONSTRUCTION     MYRINGOTOMY     x 2   NOSE SURGERY     NOSE SURGERY     SHOULDER ARTHROSCOPY WITH ROTATOR CUFF REPAIR     Lt shoulder   VENTRAL HERNIA REPAIR N/A 06/14/2013   Procedure: LAPAROSCOPIC VENTRAL HERNIA;  Surgeon: Liz Malady, MD;  Location: [facility];  Service: General;  Laterality: N/A;   Social History   Socioeconomic  History   Marital status: Married    Spouse name: Scientist, research (medical)   Number of children: 1   Years of education: college   Highest education level: Not on file  Occupational History   Occupation: [employer]    Employer: [employer]  Tobacco Use   Smoking status: Never   Smokeless tobacco: Never  Vaping Use   Vaping Use: Never used  Substance and Sexual Activity   Alcohol use: No   Drug use: No   Sexual activity: Yes  Other Topics Concern   Not on file  Social History Narrative   ** Merged History Encounter **       Social Determinants of Health   Financial Resource Strain: Not on file  Food Insecurity: Not on file  Transportation Needs: Not on file  Physical Activity: Not on file  Stress: Not on file  Social Connections: Not on file   Family History  Problem Relation Age of Onset   Heart disease Maternal Grandmother    Heart disease Paternal Grandfather    No Known Allergies Prior to Admission medications   Medication Sig Start Date End Date Taking? Authorizing Provider  diclofenac (VOLTAREN) 75 MG EC tablet Take 75  mg by mouth 2 (two) times daily.   Yes [provider]  fenofibrate 160 MG tablet Take 160 mg by mouth daily. 01/01/20  Yes [provider]  levocetirizine (XYZAL) 5 MG tablet Take 5 mg by mouth daily.   Yes [provider]  metFORMIN (GLUCOPHAGE) 500 MG tablet Take by mouth 2 (two) times daily with a meal.   Yes [provider]  montelukast (SINGULAIR) 10 MG tablet Take 10 mg by mouth at bedtime.   Yes [provider]  rosuvastatin (CRESTOR) 10 MG tablet Take 10 mg by mouth daily.   Yes [provider]  tadalafil (CIALIS) 20 MG tablet Take 20 mg by mouth daily as needed for erectile dysfunction.   Yes [provider]  tirzepatide Greggory Keen) 12.5 MG/0.5ML Pen Inject 12.5 mg into the skin once a week. NONE TAKEN IN 3 WEEKS    [provider]    ROS: All other systems  have been reviewed and were otherwise negative with the exception of those mentioned in the HPI and as above.  Physical Exam: General: Alert, no acute distress Cardiovascular: No pedal edema Respiratory: No cyanosis, no use of accessory musculature GI: No organomegaly, abdomen is soft and non-tender Skin: No lesions in the area of chief complaint Neurologic: Sensation intact distally Psychiatric: Patient is competent for consent with normal mood and affect Lymphatic: No axillary or cervical lymphadenopathy  MUSCULOSKELETAL:  On examination, range of motion of the knee is full. There is tenderness to palpation over the medial side more than lateral, however it is present on both sides.  Imaging: MRI and X-rays were reviewed of the left knee.  X-rays demonstrate some early mild arthritic changes, however there is preserved joint space in the left knee. MRI demonstrates areas of cartilage loss. However there is a complex lateral and medial meniscus tear. Some mild bony edema in the tibia.   Assessment: left knee medial meniscus tear, synovitis  Plan: Plan for Procedure(s): KNEE ARTHROSCOPY WITH MEDIAL MENISECTOMY SYNOVECTOMY  We had a long discussion with the patient regarding meniscus tears and arthritis. He does have some arthritis and he will have continued symptoms for that. However, his mechanical symptoms and acute pain seem to be the primary concern. The risks, benefits and concerns of a medial and lateral meniscectomy and synovectomy were discussed  The risks benefits and alternatives were discussed with the patient including but not limited to the risks of nonoperative treatment, versus surgical intervention including infection, bleeding, nerve injury,  blood clots, cardiopulmonary complications, morbidity, mortality, among others, and they were willing to proceed.   The patient acknowledged the explanation, agreed to proceed with the plan and consent was signed.   Operative  Plan: Left knee arthroscopy with medial and lateral meniscectomy Discharge Medications: standard DVT Prophylaxis: aspirin Physical Therapy: outpatient PT Special Discharge needs: +/-   Vernetta Honey, PA-C  10/02/2022 4:47 PM

## 2022-10-02 NOTE — Discharge Instructions (Signed)
Ramond Marrow MD, MPH Alfonse Alpers, PA-C [facility] [address] [phone] (tel)   [phone] (fax)   POST-OPERATIVE INSTRUCTIONS - Knee Arthroscopy  WOUND CARE - You may remove the Operative Dressing on Post-Op Day #3 (72hrs after surgery).   -  Alternatively if you would like you can leave dressing on until follow-up if within 7-8 days but keep it dry. - Leave steri-strips in place until they fall off on their own, usually 2 weeks postop. - An ACE wrap may be used to control swelling, do not wrap this too tight.  If the initial ACE wrap feels too tight you may loosen it. - There may be a small amount of fluid/bleeding leaking at the surgical site.  - This is normal; the knee is filled with fluid during the procedure and can leak for 24-48hrs after surgery. You may change/reinforce the bandage as needed.  - Use the Cryocuff or Ice as often as possible for the first 7 days, then as needed for pain relief. Always keep a towel, ACE wrap or other barrier between the cooling unit and your skin.  - You may shower on Post-Op Day #3. Gently pat the area dry.  - Do not soak the knee in water or submerge it.  - Do not go swimming in the pool or ocean until 4 weeks after surgery or when otherwise instructed.  Keep dry incisions as dry as possible.   BRACE/AMBULATION  -            You will not need a brace after this procedure.   - You may use crutches initially to help you weight bear, but this is not required - You can put full weight on your operative leg as you feel comfortable  PHYSICAL THERAPY - You will begin physical therapy soon after surgery (unless otherwise specified) - Please call to set up an appointment, if you do not already have one  - Let our office if there are any issues with scheduling your therapy  - You have a physical therapy appointment scheduled at [facility] (across the hall from our office) on Monday 4/22 @ 4:45   REGIONAL  ANESTHESIA (NERVE BLOCKS) The anesthesia team may have performed a nerve block for you this is a great tool used to minimize pain.   The block may start wearing off overnight (between 8-24 hours postop) When the block wears off, your pain may go from nearly zero to the pain you would have had postop without the block. This is an abrupt transition but nothing dangerous is happening.   This can be a challenging period but utilize your as needed pain medications to try and manage this period. We suggest you use the pain medication the first night prior to going to bed, to ease this transition.  You may take an extra dose of narcotic when this happens if needed   POST-OP MEDICATIONS- Multimodal approach to pain control In general your pain will be controlled with a combination of substances.  Prescriptions unless otherwise discussed are electronically sent to your pharmacy.  This is a carefully made plan we use to minimize narcotic use.     Diclofenac - Anti-inflammatory medication taken on a scheduled basis Acetaminophen - Non-narcotic pain medicine taken on a scheduled basis  Tramadol - This is a strong narcotic, to be used only on an "as needed" basis for SEVERE pain. Aspirin  - This medicine is used to minimize the risk of  blood clots after surgery. Zofran - take as needed for nausea    FOLLOW-UP   Please call the office to schedule a follow-up appointment for your incision check, 7-10 days post-operatively.   IF YOU HAVE ANY QUESTIONS, PLEASE FEEL FREE TO CALL OUR OFFICE.   HELPFUL INFORMATION   Keep your leg elevated to decrease swelling, which will then in turn decrease your pain. I would elevate the foot of your bed by putting a couple of couch pillows between your mattress and box spring. I would not keep pillow directly under your ankle.  - Do not sleep with a pillow behind your knee even if it is more comfortable as this may make it harder to get your knee fully straight  long term.   There will be MORE swelling on days 1-3 than there is on the day of surgery.  This also is normal. The swelling will decrease with the anti-inflammatory medication, ice and keeping it elevated. The swelling will make it more difficult to bend your knee. As the swelling goes down your motion will become easier   You may develop swelling and bruising that extends from your knee down to your calf and perhaps even to your foot over the next week. Do not be alarmed. This too is normal, and it is due to gravity   There may be some numbness adjacent to the incision site. This may last for 6-12 months or longer in some patients and is expected.   You may return to sedentary work/school in the next couple of days when you feel up to it. You will need to keep your leg elevated as much as possible    You should wean off your narcotic medicines as soon as you are able.  Most patients will be off or using minimal narcotics before their first postop appointment.    We suggest you use the pain medication the first night prior to going to bed, in order to ease any pain when the anesthesia wears off. You should avoid taking pain medications on an empty stomach as it will make you nauseous.   Do not drink alcoholic beverages or take illicit drugs when taking pain medications.   It is against the law to drive while taking narcotics. You cannot drive if your Right leg is in brace locked in extension.   Pain medication may make you constipated.  Below are a few solutions to try in this order:  o Decrease the amount of pain medication if you aren't having pain.  o Drink lots of decaffeinated fluids.  o Drink prune juice and/or eat dried prunes   o If the first 3 don't work start with additional solutions  o Take Colace - an over-the-counter stool softener  o Take Senokot - an over-the-counter laxative  o Take Miralax - a stronger over-the-counter laxative    For more information  including helpful videos and documents visit our website:   [email or web address]    Post Anesthesia Home Care Instructions  Activity: Get plenty of rest for the remainder of the day. A responsible individual must stay with you for 24 hours following the procedure.  For the next 24 hours, DO NOT: -Drive a car -Advertising copywriter -Drink alcoholic beverages -Take any medication unless instructed by your physician -Make any legal decisions or sign important papers.  Meals: Start with liquid foods such as gelatin or soup. Progress to regular foods as tolerated. Avoid greasy, spicy, heavy foods. If nausea and/or vomiting occur, drink  only clear liquids until the nausea and/or vomiting subsides. Call your physician if vomiting continues.  Special Instructions/Symptoms: Your throat may feel dry or sore from the anesthesia or the breathing tube placed in your throat during surgery. If this causes discomfort, gargle with warm salt water. The discomfort should disappear within 24 hours.  If you had a scopolamine patch placed behind your ear for the management of post- operative nausea and/or vomiting:  1. The medication in the patch is effective for 72 hours, after which it should be removed.  Wrap patch in a tissue and discard in the trash. Wash hands thoroughly with soap and water. 2. You may remove the patch earlier than 72 hours if you experience unpleasant side effects which may include dry mouth, dizziness or visual disturbances. 3. Avoid touching the patch. Wash your hands with soap and water after contact with the patch.    May have Tylenol at 2pm today

## 2022-10-03 ENCOUNTER — Encounter (HOSPITAL_BASED_OUTPATIENT_CLINIC_OR_DEPARTMENT_OTHER): Payer: Self-pay | Admitting: Orthopaedic Surgery

## 2022-10-03 ENCOUNTER — Other Ambulatory Visit: Payer: Self-pay

## 2022-10-03 ENCOUNTER — Ambulatory Visit (HOSPITAL_BASED_OUTPATIENT_CLINIC_OR_DEPARTMENT_OTHER): Payer: BC Managed Care – PPO | Admitting: Anesthesiology

## 2022-10-03 ENCOUNTER — Ambulatory Visit (HOSPITAL_BASED_OUTPATIENT_CLINIC_OR_DEPARTMENT_OTHER)
Admission: RE | Admit: 2022-10-03 | Discharge: 2022-10-03 | Disposition: A | Discharge status: Home / Self Care | Payer: BC Managed Care – PPO | Source: Ambulatory Visit | Attending: Orthopaedic Surgery | Admitting: Orthopaedic Surgery

## 2022-10-03 ENCOUNTER — Encounter (HOSPITAL_BASED_OUTPATIENT_CLINIC_OR_DEPARTMENT_OTHER)
Admission: RE | Disposition: A | Discharge status: Home / Self Care | Payer: Self-pay | Source: Ambulatory Visit | Attending: Orthopaedic Surgery

## 2022-10-03 DIAGNOSIS — X501XXA Overexertion from prolonged static or awkward postures, initial encounter: Secondary | ICD-10-CM | POA: Diagnosis not present

## 2022-10-03 DIAGNOSIS — E119 Type 2 diabetes mellitus without complications: Secondary | ICD-10-CM

## 2022-10-03 DIAGNOSIS — Z7984 Long term (current) use of oral hypoglycemic drugs: Secondary | ICD-10-CM | POA: Diagnosis not present

## 2022-10-03 DIAGNOSIS — Z6841 Body Mass Index (BMI) 40.0 and over, adult: Secondary | ICD-10-CM | POA: Diagnosis not present

## 2022-10-03 DIAGNOSIS — S83242A Other tear of medial meniscus, current injury, left knee, initial encounter: Secondary | ICD-10-CM | POA: Diagnosis not present

## 2022-10-03 DIAGNOSIS — M65862 Other synovitis and tenosynovitis, left lower leg: Secondary | ICD-10-CM | POA: Diagnosis not present

## 2022-10-03 DIAGNOSIS — G4733 Obstructive sleep apnea (adult) (pediatric): Secondary | ICD-10-CM | POA: Diagnosis not present

## 2022-10-03 HISTORY — PX: KNEE ARTHROSCOPY WITH MEDIAL MENISECTOMY: SHX5651

## 2022-10-03 HISTORY — PX: SYNOVECTOMY: SHX5180

## 2022-10-03 HISTORY — DX: Type 2 diabetes mellitus without complications: E11.9

## 2022-10-03 LAB — GLUCOSE, CAPILLARY
Glucose-Capillary: 130 mg/dL — ABNORMAL HIGH (ref 70–99)
Glucose-Capillary: 117 mg/dL — ABNORMAL HIGH (ref 70–99)

## 2022-10-03 SURGERY — ARTHROSCOPY, KNEE, WITH MEDIAL MENISCECTOMY
Anesthesia: General | Site: Knee | Laterality: Left

## 2022-10-03 MED ORDER — BUPIVACAINE HCL (PF) 0.25 % IJ SOLN
INTRAMUSCULAR | Status: AC
  Filled 2022-10-03: qty 30

## 2022-10-03 MED ORDER — MIDAZOLAM HCL 2 MG/2ML IJ SOLN
INTRAMUSCULAR | Status: AC
  Filled 2022-10-03: qty 2

## 2022-10-03 MED ORDER — PROPOFOL 10 MG/ML IV BOLUS
INTRAVENOUS | Status: DC | PRN
  Administered 2022-10-03: 200 mg via INTRAVENOUS
  Administered 2022-10-03: 30 mg via INTRAVENOUS
  Administered 2022-10-03: 70 mg via INTRAVENOUS

## 2022-10-03 MED ORDER — ASPIRIN 81 MG PO CHEW: 81.0000 mg | CHEWABLE_TABLET | Freq: Two times a day (BID) | ORAL | 0 refills | Status: AC

## 2022-10-03 MED ORDER — ACETAMINOPHEN 500 MG PO TABS: 1000.0000 mg | ORAL_TABLET | Freq: Once | ORAL | Status: DC

## 2022-10-03 MED ORDER — PROPOFOL 10 MG/ML IV BOLUS
INTRAVENOUS | Status: AC
  Filled 2022-10-03: qty 20

## 2022-10-03 MED ORDER — MIDAZOLAM HCL 5 MG/5ML IJ SOLN
INTRAMUSCULAR | Status: DC | PRN
  Administered 2022-10-03: 2 mg via INTRAVENOUS

## 2022-10-03 MED ORDER — ONDANSETRON HCL 4 MG/2ML IJ SOLN
INTRAMUSCULAR | Status: DC | PRN
  Administered 2022-10-03: 4 mg via INTRAVENOUS

## 2022-10-03 MED ORDER — DICLOFENAC SODIUM 75 MG PO TBEC: 75.0000 mg | DELAYED_RELEASE_TABLET | Freq: Two times a day (BID) | ORAL | 0 refills | Status: AC

## 2022-10-03 MED ORDER — DEXAMETHASONE SODIUM PHOSPHATE 10 MG/ML IJ SOLN
INTRAMUSCULAR | Status: AC
  Filled 2022-10-03: qty 1

## 2022-10-03 MED ORDER — ACETAMINOPHEN 10 MG/ML IV SOLN
INTRAVENOUS | Status: DC | PRN
  Administered 2022-10-03: 1000 mg via INTRAVENOUS

## 2022-10-03 MED ORDER — FENTANYL CITRATE (PF) 100 MCG/2ML IJ SOLN
INTRAMUSCULAR | Status: AC
  Filled 2022-10-03: qty 2

## 2022-10-03 MED ORDER — ONDANSETRON HCL 4 MG/2ML IJ SOLN
INTRAMUSCULAR | Status: AC
  Filled 2022-10-03: qty 2

## 2022-10-03 MED ORDER — ARTIFICIAL TEARS OPHTHALMIC OINT
TOPICAL_OINTMENT | OPHTHALMIC | Status: AC
  Filled 2022-10-03: qty 3.5

## 2022-10-03 MED ORDER — VANCOMYCIN HCL 1000 MG IV SOLR
INTRAVENOUS | Status: AC
  Filled 2022-10-03: qty 20

## 2022-10-03 MED ORDER — CEFAZOLIN IN SODIUM CHLORIDE 3-0.9 GM/100ML-% IV SOLN
3.0000 g | INTRAVENOUS | Status: AC
  Administered 2022-10-03: 3 g via INTRAVENOUS

## 2022-10-03 MED ORDER — SODIUM CHLORIDE 0.9 % IR SOLN
Status: DC | PRN
  Administered 2022-10-03 (×2): 3000 mL

## 2022-10-03 MED ORDER — DEXAMETHASONE SODIUM PHOSPHATE 10 MG/ML IJ SOLN
INTRAMUSCULAR | Status: DC | PRN
  Administered 2022-10-03: 8 mg via INTRAVENOUS

## 2022-10-03 MED ORDER — ACETAMINOPHEN 10 MG/ML IV SOLN
INTRAVENOUS | Status: AC
  Filled 2022-10-03: qty 100

## 2022-10-03 MED ORDER — LACTATED RINGERS IV SOLN: INTRAVENOUS | Status: DC

## 2022-10-03 MED ORDER — LIDOCAINE 2% (20 MG/ML) 5 ML SYRINGE
INTRAMUSCULAR | Status: AC
  Filled 2022-10-03: qty 5

## 2022-10-03 MED ORDER — BUPIVACAINE HCL (PF) 0.25 % IJ SOLN
INTRAMUSCULAR | Status: DC | PRN
  Administered 2022-10-03: 30 mL

## 2022-10-03 MED ORDER — TRAMADOL HCL 50 MG PO TABS: 50.0000 mg | ORAL_TABLET | Freq: Four times a day (QID) | ORAL | 0 refills | Status: AC | PRN

## 2022-10-03 MED ORDER — CEFAZOLIN IN SODIUM CHLORIDE 3-0.9 GM/100ML-% IV SOLN
INTRAVENOUS | Status: AC
  Filled 2022-10-03: qty 100

## 2022-10-03 MED ORDER — ONDANSETRON HCL 4 MG PO TABS: 4.0000 mg | ORAL_TABLET | Freq: Three times a day (TID) | ORAL | 0 refills | Status: AC | PRN

## 2022-10-03 MED ORDER — FENTANYL CITRATE (PF) 100 MCG/2ML IJ SOLN: 25.0000 ug | INTRAMUSCULAR | Status: DC | PRN

## 2022-10-03 MED ORDER — FENTANYL CITRATE (PF) 250 MCG/5ML IJ SOLN
INTRAMUSCULAR | Status: DC | PRN
  Administered 2022-10-03 (×2): 50 ug via INTRAVENOUS

## 2022-10-03 MED ORDER — LIDOCAINE 2% (20 MG/ML) 5 ML SYRINGE
INTRAMUSCULAR | Status: DC | PRN
  Administered 2022-10-03: 100 mg via INTRAVENOUS

## 2022-10-03 MED ORDER — ACETAMINOPHEN 500 MG PO TABS: 1000.0000 mg | ORAL_TABLET | Freq: Three times a day (TID) | ORAL | 0 refills | Status: AC

## 2022-10-03 SURGICAL SUPPLY — 35 items
APL PRP STRL LF DISP 70% ISPRP (MISCELLANEOUS) ×1
BANDAGE ESMARK 6X9 LF (GAUZE/BANDAGES/DRESSINGS) IMPLANT
BNDG CMPR 5X62 HK CLSR LF (GAUZE/BANDAGES/DRESSINGS) ×1
BNDG CMPR 6"X 5 YARDS HK CLSR (GAUZE/BANDAGES/DRESSINGS) ×1
BNDG CMPR 9X6 STRL LF SNTH (GAUZE/BANDAGES/DRESSINGS) ×1
BNDG ELASTIC 6INX 5YD STR LF (GAUZE/BANDAGES/DRESSINGS) ×2 IMPLANT
BNDG ESMARK 6X9 LF (GAUZE/BANDAGES/DRESSINGS) ×1
CHLORAPREP W/TINT 26 (MISCELLANEOUS) ×2 IMPLANT
CLSR STERI-STRIP ANTIMIC 1/2X4 (GAUZE/BANDAGES/DRESSINGS) ×2 IMPLANT
CUFF TOURN SGL QUICK 34 (TOURNIQUET CUFF) ×1
CUFF TRNQT CYL 34X4.125X (TOURNIQUET CUFF) ×2 IMPLANT
DISSECTOR 4.0MMX13CM CVD (MISCELLANEOUS) ×2 IMPLANT
DRAPE ARTHROSCOPY W/POUCH 90 (DRAPES) ×2 IMPLANT
DRAPE IMP U-DRAPE 54X76 (DRAPES) ×2 IMPLANT
DRAPE U-SHAPE 47X51 STRL (DRAPES) ×2 IMPLANT
GAUZE SPONGE 4X4 12PLY STRL (GAUZE/BANDAGES/DRESSINGS) ×2 IMPLANT
GLOVE BIO SURGEON STRL SZ 6.5 (GLOVE) ×2 IMPLANT
GLOVE BIOGEL PI IND STRL 6.5 (GLOVE) ×2 IMPLANT
GLOVE BIOGEL PI IND STRL 8 (GLOVE) ×2 IMPLANT
GLOVE ECLIPSE 8.0 STRL XLNG CF (GLOVE) ×4 IMPLANT
GOWN STRL REUS W/ TWL LRG LVL3 (GOWN DISPOSABLE) ×2 IMPLANT
GOWN STRL REUS W/TWL LRG LVL3 (GOWN DISPOSABLE) ×2
GOWN STRL REUS W/TWL XL LVL3 (GOWN DISPOSABLE) ×2 IMPLANT
KIT TURNOVER KIT B (KITS) ×2 IMPLANT
MANIFOLD NEPTUNE II (INSTRUMENTS) IMPLANT
NS IRRIG 1000ML POUR BTL (IV SOLUTION) IMPLANT
PACK ARTHROSCOPY DSU (CUSTOM PROCEDURE TRAY) ×2 IMPLANT
PORT APPOLLO RF 90DEGREE MULTI (SURGICAL WAND) IMPLANT
SLEEVE SCD COMPRESS KNEE MED (STOCKING) ×2 IMPLANT
SUT MNCRL AB 4-0 PS2 18 (SUTURE) ×2 IMPLANT
TOWEL GREEN STERILE FF (TOWEL DISPOSABLE) ×2 IMPLANT
TUBE CONNECTING 20X1/4 (TUBING) ×2 IMPLANT
TUBING ARTHROSCOPY IRRIG 16FT (MISCELLANEOUS) ×2 IMPLANT
WATER STERILE IRR 1000ML POUR (IV SOLUTION) ×2 IMPLANT
WRAP KNEE MAXI GEL POST OP (GAUZE/BANDAGES/DRESSINGS) IMPLANT

## 2022-10-03 NOTE — Op Note (Signed)
Orthopaedic Surgery Operative Note (CSN: [id number])  Jerome Kline  Oct 02, 1965 Date of Surgery: 10/03/2022   Diagnoses:  Left medial meniscus tear and mechanical symptoms  Procedure: Left medial partial meniscectomy 2 compartment synovectomy   Operative Finding Exam under anesthesia: 3 degree flexion contracture, flexion 120 degrees Suprapatellar pouch: Normal with synovitis Patellofemoral Compartment: Grade 4 bone-on-bone cartilage loss laterally on the patella as well as the trochlea with areas of loose cartilage Medial Compartment: Grade 3 changes diffusely over the femoral condyle and tibial plateau.  Complex degenerative medial meniscus tear noted.  Parrot-beak type tear debrided back to stable base, 50% total meniscal volume involved. Lateral Compartment: Central area of grade 3 cartilage loss on the lateral femoral condyle. Intercondylar Notch: Normal  Successful completion of the planned procedure.  Patient has enough arthritic change that I think that he would likely be a candidate for total knee arthroplasty rather than a unicompartmental arthroplasty but I will leave this to my arthroplasty colleagues if he fails.  I have worries that he will have continued pain secondary to his arthritis.  Post-operative plan: The patient will be weightbearing to tolerance.  The patient will be discharged home.  DVT prophylaxis Aspirin 81 mg twice daily for 6 weeks.  Pain control with PRN pain medication preferring oral medicines.  Follow up plan will be scheduled in approximately 7 days for incision check.  Post-Op Diagnosis: Same Surgeons:Primary: Bjorn Pippin, MD Assistants:Caroline McBane PA-C Location: [facility] OR ROOM 1 Anesthesia: General with local Antibiotics: Ancef 3 g Tourniquet time:  Estimated Blood Loss: Minimal Complications: None Specimens: None Implants: * No implants in log *  Indications for Surgery:   Jerome Kline is a 57 y.o. male with mechanical symptoms in  the setting of mild to moderate arthritis that were new.  Benefits and risks of operative and nonoperative management were discussed prior to surgery with patient/guardian(s) and informed consent form was completed.  Specific risks including infection, need for additional surgery, continued pain, arthritic pain, stiffness amongst others.   Procedure:   The patient was identified properly. Informed consent was obtained and the surgical site was marked. The patient was taken up to suite where general anesthesia was induced. The patient was placed in the supine position with a post against the surgical leg and a nonsterile tourniquet applied. The surgical leg was then prepped and draped usual sterile fashion.  A standard surgical timeout was performed.  2 standard anterior portals were made and diagnostic arthroscopy performed. Please note the findings as noted above.  Treatment or basket to be back the medial meniscus to a stable base.  Chondroplasty performed of the medial femoral condyle, medial tibial plateau, patellofemoral joint and lateral femoral condyle.  Synovectomy performed of the anterior medial and lateral compartments.  Incisions closed with absorbable suture. The patient was awoken from general anesthesia and taken to the PACU in stable condition without complication.   Alfonse Alpers, PA-C, present and scrubbed throughout the case, critical for completion in a timely fashion, and for retraction, instrumentation, closure.

## 2022-10-03 NOTE — Anesthesia Procedure Notes (Signed)
Procedure Name: LMA Insertion Date/Time: 10/03/2022 7:35 AM  Performed by: Demetrio Lapping, CRNAPre-anesthesia Checklist: Patient identified, Emergency Drugs available, Suction available and Patient being monitored Patient Re-evaluated:Patient Re-evaluated prior to induction Oxygen Delivery Method: Circle System Utilized Preoxygenation: Pre-oxygenation with 100% oxygen Induction Type: IV induction Ventilation: Mask ventilation without difficulty LMA: LMA inserted LMA Size: 5.0 Number of attempts: 1 Airway Equipment and Method: Bite block Placement Confirmation: positive ETCO2 Tube secured with: Tape Dental Injury: Teeth and Oropharynx as per pre-operative assessment

## 2022-10-03 NOTE — Anesthesia Preprocedure Evaluation (Addendum)
Anesthesia Evaluation  Patient identified by MRN, date of birth, ID band Patient awake    Reviewed: Allergy & Precautions, NPO status , Patient's Chart, lab work & pertinent test results  Airway Mallampati: II  TM Distance: >3 FB Neck ROM: Full  Mouth opening: Limited Mouth Opening  Dental no notable dental hx. (+) Dental Advisory Given, Teeth Intact   Pulmonary sleep apnea and Continuous Positive Airway Pressure Ventilation    Pulmonary exam normal breath sounds clear to auscultation       Cardiovascular negative cardio ROS Normal cardiovascular exam Rhythm:Regular Rate:Normal     Neuro/Psych negative neurological ROS  negative psych ROS   GI/Hepatic negative GI ROS, Neg liver ROS,,,  Endo/Other  diabetes, Type 2, Oral Hypoglycemic Agents  Morbid obesity (BMI 43)  Renal/GU negative Renal ROS  negative genitourinary   Musculoskeletal negative musculoskeletal ROS (+)    Abdominal   Peds  Hematology negative hematology ROS (+)   Anesthesia Other Findings   Reproductive/Obstetrics                             Anesthesia Physical Anesthesia Plan  ASA: 3  Anesthesia Plan: General   Post-op Pain Management: Tylenol PO (pre-op)*   Induction: Intravenous  PONV Risk Score and Plan: 2 and Ondansetron, Dexamethasone and Midazolam  Airway Management Planned: LMA  Additional Equipment:   Intra-op Plan:   Post-operative Plan: Extubation in OR  Informed Consent: I have reviewed the patients History and Physical, chart, labs and discussed the procedure including the risks, benefits and alternatives for the proposed anesthesia with the patient or authorized representative who has indicated his/her understanding and acceptance.     Dental advisory given  Plan Discussed with: CRNA  Anesthesia Plan Comments:        Anesthesia Quick Evaluation

## 2022-10-03 NOTE — Transfer of Care (Signed)
Immediate Anesthesia Transfer of Care Note  Patient: Jerome Kline  Procedure(s) Performed: KNEE ARTHROSCOPY WITH MEDIAL MENISECTOMY (Left: Knee) SYNOVECTOMY (Left: Knee)  Patient Location: PACU  Anesthesia Type:General  Level of Consciousness: awake and patient cooperative  Airway & Oxygen Therapy: Patient Spontanous Breathing and Patient connected to face mask oxygen  Post-op Assessment: Report given to RN and Post -op Vital signs reviewed and stable  Post vital signs: Reviewed and stable  Last Vitals:  Vitals Value Taken Time  BP 148/88   Temp    Pulse 79 10/03/22 0807  Resp 25 10/03/22 0807  SpO2 93 % 10/03/22 0807  Vitals shown include unvalidated device data.  Last Pain:  Vitals:   10/03/22 0626  TempSrc: Temporal  PainSc: 3       Patients Stated Pain Goal: 3 (10/03/22 1610)  Complications: No notable events documented.

## 2022-10-03 NOTE — Interval H&P Note (Signed)
All questions answered, patient wants to proceed with procedure. ? ?

## 2022-10-03 NOTE — Anesthesia Postprocedure Evaluation (Signed)
Anesthesia Post Note  Patient: Zaidyn Claire  Procedure(s) Performed: KNEE ARTHROSCOPY WITH MEDIAL MENISECTOMY (Left: Knee) SYNOVECTOMY (Left: Knee)     Patient location during evaluation: PACU Anesthesia Type: General Level of consciousness: awake and alert Pain management: pain level controlled Vital Signs Assessment: post-procedure vital signs reviewed and stable Respiratory status: spontaneous breathing, nonlabored ventilation, respiratory function stable and patient connected to nasal cannula oxygen Cardiovascular status: blood pressure returned to baseline and stable Postop Assessment: no apparent nausea or vomiting Anesthetic complications: no  No notable events documented.  Last Vitals:  Vitals:   10/03/22 0815 10/03/22 0850  BP: (!) 146/83 (!) 148/95  Pulse: 80 71  Resp: 15 18  Temp:  36.6 C  SpO2: 93% 95%    Last Pain:  Vitals:   10/03/22 0850  TempSrc:   PainSc: 0-No pain                 Spruha Weight L Lillyonna Armstead

## 2022-10-04 ENCOUNTER — Encounter (HOSPITAL_BASED_OUTPATIENT_CLINIC_OR_DEPARTMENT_OTHER): Payer: Self-pay | Admitting: Orthopaedic Surgery

## 2022-10-05 DIAGNOSIS — G4733 Obstructive sleep apnea (adult) (pediatric): Secondary | ICD-10-CM | POA: Diagnosis not present

## 2022-10-07 DIAGNOSIS — S83232D Complex tear of medial meniscus, current injury, left knee, subsequent encounter: Secondary | ICD-10-CM | POA: Diagnosis not present

## 2022-10-10 DIAGNOSIS — S83232D Complex tear of medial meniscus, current injury, left knee, subsequent encounter: Secondary | ICD-10-CM | POA: Diagnosis not present

## 2022-10-15 ENCOUNTER — Other Ambulatory Visit: Payer: Self-pay

## 2022-10-15 ENCOUNTER — Other Ambulatory Visit (HOSPITAL_COMMUNITY): Payer: Self-pay

## 2022-10-15 DIAGNOSIS — G4733 Obstructive sleep apnea (adult) (pediatric): Secondary | ICD-10-CM | POA: Diagnosis not present

## 2022-10-15 MED ORDER — MOUNJARO 12.5 MG/0.5ML ~~LOC~~ SOAJ
12.5000 mg | SUBCUTANEOUS | 9 refills | Status: AC
  Filled 2022-10-15: qty 2, 28d supply, fill #0
  Filled 2022-11-08 – 2022-11-19 (×2): qty 2, 28d supply, fill #1

## 2022-10-17 DIAGNOSIS — S83232D Complex tear of medial meniscus, current injury, left knee, subsequent encounter: Secondary | ICD-10-CM | POA: Diagnosis not present

## 2022-10-28 DIAGNOSIS — S83232D Complex tear of medial meniscus, current injury, left knee, subsequent encounter: Secondary | ICD-10-CM | POA: Diagnosis not present

## 2022-10-30 DIAGNOSIS — G4733 Obstructive sleep apnea (adult) (pediatric): Secondary | ICD-10-CM | POA: Diagnosis not present

## 2022-11-04 DIAGNOSIS — G4733 Obstructive sleep apnea (adult) (pediatric): Secondary | ICD-10-CM | POA: Diagnosis not present

## 2022-11-08 ENCOUNTER — Other Ambulatory Visit (HOSPITAL_COMMUNITY): Payer: Self-pay

## 2022-11-13 ENCOUNTER — Other Ambulatory Visit (HOSPITAL_COMMUNITY): Payer: Self-pay

## 2022-11-15 ENCOUNTER — Other Ambulatory Visit (HOSPITAL_COMMUNITY): Payer: Self-pay

## 2022-11-16 ENCOUNTER — Other Ambulatory Visit (HOSPITAL_COMMUNITY): Payer: Self-pay

## 2022-11-19 ENCOUNTER — Other Ambulatory Visit (HOSPITAL_COMMUNITY): Payer: Self-pay

## 2022-11-19 DIAGNOSIS — E119 Type 2 diabetes mellitus without complications: Secondary | ICD-10-CM | POA: Diagnosis not present

## 2022-11-22 ENCOUNTER — Other Ambulatory Visit (HOSPITAL_COMMUNITY): Payer: Self-pay

## 2022-11-30 DIAGNOSIS — G4733 Obstructive sleep apnea (adult) (pediatric): Secondary | ICD-10-CM | POA: Diagnosis not present

## 2022-12-30 DIAGNOSIS — G4733 Obstructive sleep apnea (adult) (pediatric): Secondary | ICD-10-CM | POA: Diagnosis not present

## 2023-01-30 DIAGNOSIS — G4733 Obstructive sleep apnea (adult) (pediatric): Secondary | ICD-10-CM | POA: Diagnosis not present

## 2023-02-07 DIAGNOSIS — G4733 Obstructive sleep apnea (adult) (pediatric): Secondary | ICD-10-CM | POA: Diagnosis not present

## 2023-02-12 DIAGNOSIS — L570 Actinic keratosis: Secondary | ICD-10-CM | POA: Diagnosis not present

## 2023-02-12 DIAGNOSIS — L821 Other seborrheic keratosis: Secondary | ICD-10-CM | POA: Diagnosis not present

## 2023-02-12 DIAGNOSIS — L738 Other specified follicular disorders: Secondary | ICD-10-CM | POA: Diagnosis not present

## 2023-02-12 DIAGNOSIS — L814 Other melanin hyperpigmentation: Secondary | ICD-10-CM | POA: Diagnosis not present

## 2023-02-12 DIAGNOSIS — L820 Inflamed seborrheic keratosis: Secondary | ICD-10-CM | POA: Diagnosis not present

## 2023-03-02 DIAGNOSIS — G4733 Obstructive sleep apnea (adult) (pediatric): Secondary | ICD-10-CM | POA: Diagnosis not present

## 2023-04-01 DIAGNOSIS — G4733 Obstructive sleep apnea (adult) (pediatric): Secondary | ICD-10-CM | POA: Diagnosis not present

## 2023-04-22 DIAGNOSIS — R030 Elevated blood-pressure reading, without diagnosis of hypertension: Secondary | ICD-10-CM | POA: Diagnosis not present

## 2023-04-22 DIAGNOSIS — G4733 Obstructive sleep apnea (adult) (pediatric): Secondary | ICD-10-CM | POA: Diagnosis not present

## 2023-04-24 ENCOUNTER — Ambulatory Visit: Payer: BC Managed Care – PPO

## 2023-04-24 ENCOUNTER — Encounter: Payer: Self-pay | Admitting: Podiatry

## 2023-04-24 ENCOUNTER — Ambulatory Visit: Payer: BC Managed Care – PPO | Admitting: Podiatry

## 2023-04-24 VITALS — Ht 73.0 in | Wt 327.8 lb

## 2023-04-24 DIAGNOSIS — M205X2 Other deformities of toe(s) (acquired), left foot: Secondary | ICD-10-CM

## 2023-04-24 DIAGNOSIS — M7752 Other enthesopathy of left foot: Secondary | ICD-10-CM

## 2023-04-24 MED ORDER — TRIAMCINOLONE ACETONIDE 10 MG/ML IJ SUSP
10.0000 mg | Freq: Once | INTRAMUSCULAR | Status: AC
Start: 2023-04-24 — End: 2023-04-24
  Administered 2023-04-24: 10 mg via INTRA_ARTICULAR

## 2023-04-25 NOTE — Progress Notes (Signed)
Subjective:   Patient ID: Jerome Kline, male   DOB: 57 y.o.   MRN: [id number]   HPI Patient presents stating he had foot surgery around 3 years ago and is developed a lot of inflammation not in the big toe joint but in the joint of the inner phalangeal joint and is not sure what happened stated started all of a sudden after playing golf   ROS      Objective:  Physical Exam  Neuro vascular status intact good range of motion first MPJ inflammation pain of the inner phalangeal joint left big toe with fluid buildup     Assessment:  Inflammatory capsulitis inner phalangeal joint left hallux with possibility for systemic condition     Plan:  H&P reviewed condition and causes.  Reviewed x-ray at this point I went ahead and I injected the inner phalangeal joint 3 mg dexamethasone Kenalog 5 mg Xylocaine and advised if this were to occur again we will get a need to get blood work and rule out other pathology  X-rays indicate that there is some indications of mild arthritis around that inner phalangeal joint within GA doing beautifully clinically and radiographically showing some arthritis but not symptomatic at all

## 2023-05-02 DIAGNOSIS — G4733 Obstructive sleep apnea (adult) (pediatric): Secondary | ICD-10-CM | POA: Diagnosis not present

## 2023-05-27 DIAGNOSIS — Z125 Encounter for screening for malignant neoplasm of prostate: Secondary | ICD-10-CM | POA: Diagnosis not present

## 2023-05-27 DIAGNOSIS — E119 Type 2 diabetes mellitus without complications: Secondary | ICD-10-CM | POA: Diagnosis not present

## 2023-05-27 DIAGNOSIS — E291 Testicular hypofunction: Secondary | ICD-10-CM | POA: Diagnosis not present

## 2023-05-27 DIAGNOSIS — E781 Pure hyperglyceridemia: Secondary | ICD-10-CM | POA: Diagnosis not present

## 2023-05-30 DIAGNOSIS — G4733 Obstructive sleep apnea (adult) (pediatric): Secondary | ICD-10-CM | POA: Diagnosis not present

## 2023-06-01 DIAGNOSIS — G4733 Obstructive sleep apnea (adult) (pediatric): Secondary | ICD-10-CM | POA: Diagnosis not present

## 2023-06-04 DIAGNOSIS — Z1339 Encounter for screening examination for other mental health and behavioral disorders: Secondary | ICD-10-CM | POA: Diagnosis not present

## 2023-06-04 DIAGNOSIS — Z0000 Encounter for general adult medical examination without abnormal findings: Secondary | ICD-10-CM | POA: Diagnosis not present

## 2023-06-04 DIAGNOSIS — Z1331 Encounter for screening for depression: Secondary | ICD-10-CM | POA: Diagnosis not present

## 2023-06-04 DIAGNOSIS — Z23 Encounter for immunization: Secondary | ICD-10-CM | POA: Diagnosis not present

## 2023-06-04 DIAGNOSIS — E1169 Type 2 diabetes mellitus with other specified complication: Secondary | ICD-10-CM | POA: Diagnosis not present

## 2023-06-26 DIAGNOSIS — Z1211 Encounter for screening for malignant neoplasm of colon: Secondary | ICD-10-CM | POA: Diagnosis not present

## 2023-06-26 DIAGNOSIS — R1311 Dysphagia, oral phase: Secondary | ICD-10-CM | POA: Diagnosis not present

## 2023-07-02 DIAGNOSIS — G4733 Obstructive sleep apnea (adult) (pediatric): Secondary | ICD-10-CM | POA: Diagnosis not present

## 2023-08-02 DIAGNOSIS — G4733 Obstructive sleep apnea (adult) (pediatric): Secondary | ICD-10-CM | POA: Diagnosis not present

## 2023-08-27 DIAGNOSIS — L0201 Cutaneous abscess of face: Secondary | ICD-10-CM | POA: Diagnosis not present

## 2023-08-29 DIAGNOSIS — G4733 Obstructive sleep apnea (adult) (pediatric): Secondary | ICD-10-CM | POA: Diagnosis not present

## 2023-09-11 DIAGNOSIS — E119 Type 2 diabetes mellitus without complications: Secondary | ICD-10-CM | POA: Diagnosis not present

## 2023-10-11 ENCOUNTER — Encounter (HOSPITAL_BASED_OUTPATIENT_CLINIC_OR_DEPARTMENT_OTHER): Payer: Self-pay

## 2023-10-11 ENCOUNTER — Emergency Department (HOSPITAL_BASED_OUTPATIENT_CLINIC_OR_DEPARTMENT_OTHER)
Admission: EM | Admit: 2023-10-11 | Discharge: 2023-10-11 | Disposition: A | Discharge status: Home / Self Care | Attending: Emergency Medicine | Admitting: Emergency Medicine

## 2023-10-11 ENCOUNTER — Other Ambulatory Visit: Payer: Self-pay

## 2023-10-11 DIAGNOSIS — B309 Viral conjunctivitis, unspecified: Secondary | ICD-10-CM | POA: Diagnosis not present

## 2023-10-11 DIAGNOSIS — J069 Acute upper respiratory infection, unspecified: Secondary | ICD-10-CM | POA: Insufficient documentation

## 2023-10-11 DIAGNOSIS — B9789 Other viral agents as the cause of diseases classified elsewhere: Secondary | ICD-10-CM | POA: Diagnosis not present

## 2023-10-11 DIAGNOSIS — H1033 Unspecified acute conjunctivitis, bilateral: Secondary | ICD-10-CM | POA: Insufficient documentation

## 2023-10-11 DIAGNOSIS — Z7984 Long term (current) use of oral hypoglycemic drugs: Secondary | ICD-10-CM | POA: Diagnosis not present

## 2023-10-11 DIAGNOSIS — E119 Type 2 diabetes mellitus without complications: Secondary | ICD-10-CM | POA: Insufficient documentation

## 2023-10-11 DIAGNOSIS — X58XXXA Exposure to other specified factors, initial encounter: Secondary | ICD-10-CM | POA: Insufficient documentation

## 2023-10-11 DIAGNOSIS — H5789 Other specified disorders of eye and adnexa: Secondary | ICD-10-CM | POA: Diagnosis not present

## 2023-10-11 DIAGNOSIS — T1511XA Foreign body in conjunctival sac, right eye, initial encounter: Secondary | ICD-10-CM | POA: Diagnosis not present

## 2023-10-11 LAB — RESP PANEL BY RT-PCR (RSV, FLU A&B, COVID)  RVPGX2
Influenza A by PCR: NEGATIVE
Influenza B by PCR: NEGATIVE
Resp Syncytial Virus by PCR: NEGATIVE
SARS Coronavirus 2 by RT PCR: NEGATIVE

## 2023-10-11 MED ORDER — FLUORESCEIN SODIUM 1 MG OP STRP
1.0000 | ORAL_STRIP | Freq: Once | OPHTHALMIC | Status: AC
  Administered 2023-10-11: 1 via OPHTHALMIC
  Filled 2023-10-11: qty 1

## 2023-10-11 MED ORDER — TETRACAINE HCL 0.5 % OP SOLN
1.0000 [drp] | Freq: Once | OPHTHALMIC | Status: AC
  Administered 2023-10-11: 1 [drp] via OPHTHALMIC
  Filled 2023-10-11: qty 4

## 2023-10-11 MED ORDER — TOBRAMYCIN 0.3 % OP SOLN
1.0000 [drp] | Freq: Once | OPHTHALMIC | Status: AC
  Administered 2023-10-11: 1 [drp] via OPHTHALMIC
  Filled 2023-10-11: qty 5

## 2023-10-11 NOTE — ED Triage Notes (Signed)
 Patient presents with c/o redness to R eye. States he was golfing approximately 2 weeks ago near [place] [employer]. Reports feeling a foreign body sensation from sand since golfing. Reports minimal visual impairments.

## 2023-10-11 NOTE — Discharge Instructions (Addendum)
 If your COVID test comes back positive they will not do the colonoscopy on Monday.  The results should be back later today.  Also you will use the eyedrops every 4 hours 1 drop in each eye while you are awake for the next 5 to 7 days

## 2023-10-11 NOTE — ED Provider Notes (Signed)
 [place] EMERGENCY DEPARTMENT AT [facility] Provider Note   CSN: [id number] Arrival date & time: 10/11/23  4098     History {Add pertinent medical, surgical, social history, OB history to HPI:1} Chief Complaint  Patient presents with   Eye Pain    Jerome Kline is a 58 y.o. male.  Patient is a 58 year old male with a history of kidney stones, diabetes who is presenting today with a complaint of eye irritation.  He reports he [place]ing [place] the beach 2 [place] ago and there was a lot of wind and he felt like he got some sand in his eye.  He did flush it out and reports he felt 95% better but always felt like there is still something in his eye until yesterday he started having increased redness, drainage from the right eye, itching, burning.  He reports a foreign body sensation in the lateral portion of the right eye and the eye feels a little bit blurry.  He also noticed this morning that he is starting to get some itching and redness in the left eye as well.  This morning when he woke up his right eye was matted shut.  He does not use contacts and only uses glasses to read.  He did put Visine in his right eye yesterday and then flushed it with water but does not use drops regularly.  He also reports he woke up this morning with nasal congestion, mild sore throat, congestion and a minimal cough.  The history is provided by the patient.  Eye Pain       Home Medications Prior to Admission medications   Medication Sig Start Date End Date Taking? Authorizing Provider  diclofenac  (VOLTAREN ) 75 MG EC tablet Take 1 tablet (75 mg total) by mouth 2 (two) times daily. 10/03/22   McBane, Lonnell Rob, PA-C  fenofibrate 160 MG tablet Take 160 mg by mouth daily. 01/01/20   [provider]  levocetirizine (XYZAL) 5 MG tablet Take 5 mg by mouth daily.    [provider]  metFORMIN (GLUCOPHAGE) 500 MG tablet Take by mouth 2 (two) times daily with a meal.    [provider]  montelukast (SINGULAIR) 10 MG tablet Take 10 mg by mouth at bedtime.    [provider]  rosuvastatin (CRESTOR) 10 MG tablet Take 10 mg by mouth daily.    [provider]  tadalafil (CIALIS) 20 MG tablet Take 20 mg by mouth daily as needed for erectile dysfunction.    [provider]  tirzepatide  (MOUNJARO ) 12.5 MG/0.5ML Pen Inject 12.5 mg into the skin once a week. NONE TAKEN IN 3 WEEKS    [provider]  tirzepatide  (MOUNJARO ) 12.5 MG/0.5ML Pen Inject 12.5 mg into the skin once a week. 05/17/22     traMADol  (ULTRAM ) 50 MG tablet Take 1 tablet (50 mg total) by mouth every 6 (six) hours as needed for severe pain. 10/03/22   McBane, Caroline N, PA-C      Allergies    Patient has no known allergies.    Review of Systems   Review of Systems  Eyes:  Positive for pain.    Physical Exam Updated Vital Signs BP (!) 127/100 (BP Location: Left Arm)   Pulse 89   Temp 98.5 F (36.9 C) (Oral)   Resp 16   Ht 6\' 1"  (1.854 m)   Wt 131.5 kg   SpO2 100%   BMI 38.26 kg/m  Physical Exam Vitals  and nursing note reviewed.  Constitutional:      General: He is not in acute distress.    Appearance: He is well-developed.  HENT:     Head: Normocephalic and atraumatic.     Right Ear: Tympanic membrane normal.     Left Ear: Tympanic membrane normal.     Mouth/Throat:     Mouth: Mucous membranes are moist.  Eyes:     General: Lids are normal.        Right eye: Foreign body present.     Extraocular Movements: Extraocular movements intact.     Conjunctiva/sclera:     Right eye: Right conjunctiva is injected. Exudate present.     Left eye: Left conjunctiva is injected.     Pupils: Pupils are equal, round, and reactive to light. Pupils are equal.     Comments: Foreign body found under the lateral right upper lid and removed.  No significant corneal ulcers or abrasions  Cardiovascular:     Rate and Rhythm: Normal rate and regular rhythm.     Heart  sounds: No murmur heard. Pulmonary:     Effort: Pulmonary effort is normal. No respiratory distress.     Breath sounds: Normal breath sounds. No wheezing or rales.  Abdominal:     General: There is no distension.     Palpations: Abdomen is soft.     Tenderness: There is no abdominal tenderness. There is no guarding or rebound.  Musculoskeletal:        General: No tenderness. Normal range of motion.     Cervical back: Normal range of motion and neck supple.  Skin:    General: Skin is warm and dry.     Findings: No erythema or rash.  Neurological:     Mental Status: He is alert and oriented to person, place, and time. Mental status is at baseline.  Psychiatric:        Behavior: Behavior normal.     ED Results / Procedures / Treatments   Labs (all labs ordered are listed, but only abnormal results are displayed) Labs Reviewed  RESP PANEL BY RT-PCR (RSV, FLU A&B, COVID)  [id number]    EKG None  Radiology No results found.  Procedures Procedures  {Document cardiac monitor, telemetry assessment procedure when appropriate:1}  Medications Ordered in ED Medications  tobramycin (TOBREX) 0.3 % ophthalmic solution 1 drop (has no administration in time range)  tetracaine (PONTOCAINE) 0.5 % ophthalmic solution 1 drop (1 drop Right Eye Given by Other 10/11/23 0825)  fluorescein ophthalmic strip 1 strip (1 strip Right Eye Given by Other 10/11/23 0825)    ED Course/ Medical Decision Making/ A&P   {   Click here for ABCD2, HEART and other calculatorsREFRESH Note before signing :1}                              Medical Decision Making Risk Prescription drug management.   Patient present today with symptoms most classic for conjunctivitis which started in the right eye but then moved to the left.  However concern for possible corneal abrasion due to the foreign body sensation he has been having over the last 2 weeks versus corneal ulcer.  Low suspicion for glaucoma.  Patient does not wear  contacts.  Most likely infectious bacterial versus viral as it is moving from the right to the left eye and he is having some URI symptoms.  Will swab for COVID as he  is supposed to have a colonoscopy on Monday. COVID test is pending.  Foreign body was removed from the right upper lid.  Will start patient on antibiotic drops.  {Document critical care time when appropriate:1} {Document review of labs and clinical decision tools ie heart score, Chads2Vasc2 etc:1}  {Document your independent review of radiology images, and any outside records:1} {Document your discussion with family members, caretakers, and with consultants:1} {Document social determinants of health affecting pt's care:1} {Document your decision making why or why not admission, treatments were needed:1} Final Clinical Impression(s) / ED Diagnoses Final diagnoses:  Viral upper respiratory tract infection  Foreign body in conjunctival sac, right eye, initial encounter  Acute conjunctivitis of both eyes, unspecified acute conjunctivitis type    Rx / DC Orders ED Discharge Orders     None

## 2023-10-13 DIAGNOSIS — K2970 Gastritis, unspecified, without bleeding: Secondary | ICD-10-CM | POA: Diagnosis not present

## 2023-10-13 DIAGNOSIS — D124 Benign neoplasm of descending colon: Secondary | ICD-10-CM | POA: Diagnosis not present

## 2023-10-13 DIAGNOSIS — Z1211 Encounter for screening for malignant neoplasm of colon: Secondary | ICD-10-CM | POA: Diagnosis not present

## 2023-10-13 DIAGNOSIS — R1310 Dysphagia, unspecified: Secondary | ICD-10-CM | POA: Diagnosis not present

## 2023-12-06 DIAGNOSIS — G4733 Obstructive sleep apnea (adult) (pediatric): Secondary | ICD-10-CM | POA: Diagnosis not present

## 2023-12-16 DIAGNOSIS — E1169 Type 2 diabetes mellitus with other specified complication: Secondary | ICD-10-CM | POA: Diagnosis not present

## 2023-12-26 DIAGNOSIS — M1711 Unilateral primary osteoarthritis, right knee: Secondary | ICD-10-CM | POA: Diagnosis not present

## 2023-12-31 DIAGNOSIS — M1711 Unilateral primary osteoarthritis, right knee: Secondary | ICD-10-CM | POA: Diagnosis not present

## 2024-01-07 DIAGNOSIS — M1711 Unilateral primary osteoarthritis, right knee: Secondary | ICD-10-CM | POA: Diagnosis not present

## 2024-01-14 DIAGNOSIS — M1711 Unilateral primary osteoarthritis, right knee: Secondary | ICD-10-CM | POA: Diagnosis not present

## 2024-01-21 DIAGNOSIS — M1711 Unilateral primary osteoarthritis, right knee: Secondary | ICD-10-CM | POA: Diagnosis not present

## 2024-02-23 DIAGNOSIS — L814 Other melanin hyperpigmentation: Secondary | ICD-10-CM | POA: Diagnosis not present

## 2024-02-23 DIAGNOSIS — L821 Other seborrheic keratosis: Secondary | ICD-10-CM | POA: Diagnosis not present

## 2024-02-23 DIAGNOSIS — W908XXS Exposure to other nonionizing radiation, sequela: Secondary | ICD-10-CM | POA: Diagnosis not present

## 2024-02-23 DIAGNOSIS — L578 Other skin changes due to chronic exposure to nonionizing radiation: Secondary | ICD-10-CM | POA: Diagnosis not present

## 2024-04-28 DIAGNOSIS — G4733 Obstructive sleep apnea (adult) (pediatric): Secondary | ICD-10-CM | POA: Diagnosis not present
